# Patient Record
Sex: Female | Born: 1950 | Race: White | Hispanic: No | Smoking: Never smoker
Health system: Southern US, Community
[De-identification: ages and names within clinical notes are randomized; demographics above are authoritative.]

## PROBLEM LIST (undated history)

## (undated) DIAGNOSIS — E079 Disorder of thyroid, unspecified: Secondary | ICD-10-CM

## (undated) DIAGNOSIS — D219 Benign neoplasm of connective and other soft tissue, unspecified: Secondary | ICD-10-CM

## (undated) DIAGNOSIS — E785 Hyperlipidemia, unspecified: Secondary | ICD-10-CM

## (undated) DIAGNOSIS — C801 Malignant (primary) neoplasm, unspecified: Secondary | ICD-10-CM

## (undated) DIAGNOSIS — G43909 Migraine, unspecified, not intractable, without status migrainosus: Secondary | ICD-10-CM

## (undated) DIAGNOSIS — B009 Herpesviral infection, unspecified: Secondary | ICD-10-CM

## (undated) HISTORY — PX: CATARACT EXTRACTION: SUR2

## (undated) HISTORY — DX: Hyperlipidemia, unspecified: E78.5

## (undated) HISTORY — PX: UMBILICAL HERNIA REPAIR: SHX196

## (undated) HISTORY — DX: Disorder of thyroid, unspecified: E07.9

## (undated) HISTORY — PX: TUBAL LIGATION: SHX77

## (undated) HISTORY — DX: Malignant (primary) neoplasm, unspecified: C80.1

## (undated) HISTORY — DX: Benign neoplasm of connective and other soft tissue, unspecified: D21.9

## (undated) HISTORY — DX: Herpesviral infection, unspecified: B00.9

## (undated) HISTORY — DX: Migraine, unspecified, not intractable, without status migrainosus: G43.909

## (undated) HISTORY — PX: COLONOSCOPY: SHX174

---

## 1998-09-08 ENCOUNTER — Other Ambulatory Visit: Admission: RE | Admit: 1998-09-08 | Discharge: 1998-09-08 | Payer: Self-pay | Admitting: *Deleted

## 2000-04-09 ENCOUNTER — Other Ambulatory Visit: Admission: RE | Admit: 2000-04-09 | Discharge: 2000-04-09 | Payer: Self-pay | Admitting: *Deleted

## 2003-01-14 ENCOUNTER — Other Ambulatory Visit: Admission: RE | Admit: 2003-01-14 | Discharge: 2003-01-14 | Payer: Self-pay | Admitting: Obstetrics and Gynecology

## 2004-04-14 ENCOUNTER — Ambulatory Visit: Payer: Self-pay | Admitting: Psychology

## 2004-04-25 ENCOUNTER — Ambulatory Visit: Payer: Self-pay | Admitting: Psychology

## 2004-05-16 ENCOUNTER — Ambulatory Visit: Payer: Self-pay | Admitting: Psychology

## 2004-05-29 ENCOUNTER — Ambulatory Visit: Payer: Self-pay | Admitting: Psychology

## 2004-06-27 ENCOUNTER — Ambulatory Visit: Payer: Self-pay | Admitting: Psychology

## 2004-07-20 ENCOUNTER — Ambulatory Visit: Payer: Self-pay | Admitting: Psychology

## 2004-08-03 ENCOUNTER — Ambulatory Visit: Payer: Self-pay | Admitting: Psychology

## 2004-09-14 ENCOUNTER — Other Ambulatory Visit: Admission: RE | Admit: 2004-09-14 | Discharge: 2004-09-14 | Payer: Self-pay | Admitting: *Deleted

## 2004-09-14 ENCOUNTER — Ambulatory Visit: Payer: Self-pay | Admitting: Psychology

## 2004-09-29 ENCOUNTER — Ambulatory Visit: Payer: Self-pay | Admitting: Psychology

## 2004-10-27 ENCOUNTER — Ambulatory Visit: Payer: Self-pay | Admitting: Psychology

## 2004-11-24 ENCOUNTER — Ambulatory Visit: Payer: Self-pay | Admitting: Psychology

## 2004-12-04 ENCOUNTER — Ambulatory Visit: Payer: Self-pay | Admitting: Psychology

## 2005-01-05 ENCOUNTER — Ambulatory Visit: Payer: Self-pay | Admitting: Psychology

## 2005-01-11 ENCOUNTER — Ambulatory Visit: Payer: Self-pay | Admitting: Psychology

## 2005-02-06 ENCOUNTER — Ambulatory Visit: Payer: Self-pay | Admitting: Psychology

## 2005-03-21 ENCOUNTER — Ambulatory Visit: Payer: Self-pay | Admitting: Psychology

## 2005-05-25 ENCOUNTER — Ambulatory Visit: Payer: Self-pay | Admitting: Psychology

## 2005-06-18 ENCOUNTER — Ambulatory Visit: Payer: Self-pay | Admitting: Psychology

## 2005-07-02 ENCOUNTER — Ambulatory Visit: Payer: Self-pay | Admitting: Psychology

## 2005-07-16 ENCOUNTER — Ambulatory Visit: Payer: Self-pay | Admitting: Psychology

## 2005-07-30 ENCOUNTER — Ambulatory Visit: Payer: Self-pay | Admitting: Psychology

## 2005-08-13 ENCOUNTER — Ambulatory Visit: Payer: Self-pay | Admitting: Psychology

## 2005-09-24 ENCOUNTER — Ambulatory Visit: Payer: Self-pay | Admitting: Psychology

## 2005-10-08 ENCOUNTER — Ambulatory Visit: Payer: Self-pay | Admitting: Psychology

## 2005-10-22 ENCOUNTER — Ambulatory Visit: Payer: Self-pay | Admitting: Psychology

## 2005-11-16 ENCOUNTER — Ambulatory Visit: Payer: Self-pay | Admitting: Psychology

## 2005-11-19 ENCOUNTER — Ambulatory Visit: Payer: Self-pay | Admitting: Psychology

## 2005-11-23 ENCOUNTER — Ambulatory Visit: Payer: Self-pay | Admitting: Psychology

## 2005-12-03 ENCOUNTER — Ambulatory Visit: Payer: Self-pay | Admitting: Psychology

## 2005-12-17 ENCOUNTER — Ambulatory Visit: Payer: Self-pay | Admitting: Psychology

## 2005-12-19 ENCOUNTER — Ambulatory Visit: Payer: Self-pay | Admitting: Psychology

## 2005-12-24 ENCOUNTER — Ambulatory Visit: Payer: Self-pay | Admitting: Psychology

## 2006-01-03 ENCOUNTER — Ambulatory Visit: Payer: Self-pay | Admitting: Psychology

## 2006-01-14 ENCOUNTER — Ambulatory Visit: Payer: Self-pay | Admitting: Psychology

## 2006-01-28 ENCOUNTER — Ambulatory Visit: Payer: Self-pay | Admitting: Psychology

## 2006-02-25 ENCOUNTER — Ambulatory Visit: Payer: Self-pay | Admitting: Psychology

## 2006-03-25 ENCOUNTER — Ambulatory Visit: Payer: Self-pay | Admitting: Psychology

## 2006-04-08 ENCOUNTER — Ambulatory Visit: Payer: Self-pay | Admitting: Psychology

## 2006-04-26 ENCOUNTER — Ambulatory Visit: Payer: Self-pay | Admitting: Internal Medicine

## 2006-04-30 ENCOUNTER — Other Ambulatory Visit: Admission: RE | Admit: 2006-04-30 | Discharge: 2006-04-30 | Payer: Self-pay | Admitting: Obstetrics & Gynecology

## 2006-05-03 ENCOUNTER — Ambulatory Visit: Payer: Self-pay | Admitting: Psychology

## 2006-05-15 ENCOUNTER — Ambulatory Visit: Payer: Self-pay | Admitting: Psychology

## 2006-05-17 ENCOUNTER — Ambulatory Visit: Payer: Self-pay | Admitting: Internal Medicine

## 2006-06-03 ENCOUNTER — Ambulatory Visit: Payer: Self-pay | Admitting: Psychology

## 2006-06-17 ENCOUNTER — Ambulatory Visit: Payer: Self-pay | Admitting: Psychology

## 2006-07-03 ENCOUNTER — Ambulatory Visit: Payer: Self-pay | Admitting: Psychology

## 2006-07-16 ENCOUNTER — Ambulatory Visit: Payer: Self-pay | Admitting: Psychology

## 2006-07-26 ENCOUNTER — Ambulatory Visit: Payer: Self-pay | Admitting: Psychology

## 2006-07-30 ENCOUNTER — Ambulatory Visit: Payer: Self-pay | Admitting: Psychology

## 2006-08-13 ENCOUNTER — Ambulatory Visit: Payer: Self-pay | Admitting: Psychology

## 2006-09-13 ENCOUNTER — Ambulatory Visit: Payer: Self-pay | Admitting: Psychology

## 2006-09-24 ENCOUNTER — Ambulatory Visit: Payer: Self-pay | Admitting: Psychology

## 2006-10-08 ENCOUNTER — Ambulatory Visit: Payer: Self-pay | Admitting: Psychology

## 2006-10-22 ENCOUNTER — Ambulatory Visit: Payer: Self-pay | Admitting: Psychology

## 2006-11-11 ENCOUNTER — Ambulatory Visit: Payer: Self-pay | Admitting: Psychology

## 2006-12-06 ENCOUNTER — Ambulatory Visit: Payer: Self-pay | Admitting: Psychology

## 2006-12-23 ENCOUNTER — Ambulatory Visit: Payer: Self-pay | Admitting: Psychology

## 2007-01-14 ENCOUNTER — Ambulatory Visit: Payer: Self-pay | Admitting: Psychology

## 2007-01-28 ENCOUNTER — Ambulatory Visit: Payer: Self-pay | Admitting: Psychology

## 2007-02-18 ENCOUNTER — Ambulatory Visit: Payer: Self-pay | Admitting: Psychology

## 2007-03-05 ENCOUNTER — Ambulatory Visit: Payer: Self-pay | Admitting: Psychology

## 2007-03-24 ENCOUNTER — Ambulatory Visit: Payer: Self-pay | Admitting: Psychology

## 2007-04-08 ENCOUNTER — Ambulatory Visit: Payer: Self-pay | Admitting: Psychology

## 2007-05-02 ENCOUNTER — Ambulatory Visit: Payer: Self-pay | Admitting: Psychology

## 2007-05-20 ENCOUNTER — Ambulatory Visit: Payer: Self-pay | Admitting: Psychology

## 2007-06-03 ENCOUNTER — Ambulatory Visit: Payer: Self-pay | Admitting: Psychology

## 2007-06-17 ENCOUNTER — Other Ambulatory Visit: Admission: RE | Admit: 2007-06-17 | Discharge: 2007-06-17 | Payer: Self-pay | Admitting: Obstetrics and Gynecology

## 2007-06-19 ENCOUNTER — Ambulatory Visit: Payer: Self-pay | Admitting: Psychology

## 2007-07-29 ENCOUNTER — Ambulatory Visit: Payer: Self-pay | Admitting: Psychology

## 2007-09-25 ENCOUNTER — Ambulatory Visit: Payer: Self-pay | Admitting: Psychology

## 2007-11-10 ENCOUNTER — Ambulatory Visit: Payer: Self-pay | Admitting: Psychology

## 2008-01-06 ENCOUNTER — Ambulatory Visit: Payer: Self-pay | Admitting: Psychology

## 2008-02-17 ENCOUNTER — Ambulatory Visit: Payer: Self-pay | Admitting: Psychology

## 2008-04-06 ENCOUNTER — Ambulatory Visit: Payer: Self-pay | Admitting: Psychology

## 2008-05-25 ENCOUNTER — Ambulatory Visit: Payer: Self-pay | Admitting: Psychology

## 2008-06-24 ENCOUNTER — Other Ambulatory Visit: Admission: RE | Admit: 2008-06-24 | Discharge: 2008-06-24 | Payer: Self-pay | Admitting: Obstetrics and Gynecology

## 2008-11-29 ENCOUNTER — Ambulatory Visit (HOSPITAL_COMMUNITY): Admission: RE | Admit: 2008-11-29 | Discharge: 2008-11-29 | Payer: Self-pay | Admitting: Internal Medicine

## 2008-11-29 ENCOUNTER — Encounter: Payer: Self-pay | Admitting: Pulmonary Disease

## 2008-12-16 ENCOUNTER — Ambulatory Visit: Payer: Self-pay | Admitting: Pulmonary Disease

## 2008-12-16 DIAGNOSIS — E785 Hyperlipidemia, unspecified: Secondary | ICD-10-CM | POA: Insufficient documentation

## 2008-12-16 DIAGNOSIS — R05 Cough: Secondary | ICD-10-CM

## 2009-08-04 ENCOUNTER — Encounter: Admission: RE | Admit: 2009-08-04 | Discharge: 2009-08-04 | Payer: Self-pay | Admitting: Internal Medicine

## 2009-12-19 ENCOUNTER — Ambulatory Visit: Payer: Self-pay | Admitting: Psychology

## 2009-12-26 ENCOUNTER — Ambulatory Visit: Payer: Self-pay | Admitting: Psychology

## 2010-01-09 ENCOUNTER — Ambulatory Visit: Payer: Self-pay | Admitting: Psychology

## 2010-01-20 ENCOUNTER — Ambulatory Visit: Payer: Self-pay | Admitting: Psychology

## 2010-02-02 ENCOUNTER — Ambulatory Visit: Payer: Self-pay | Admitting: Psychology

## 2010-02-06 ENCOUNTER — Ambulatory Visit: Payer: Self-pay | Admitting: Psychology

## 2010-02-20 ENCOUNTER — Ambulatory Visit: Payer: Self-pay | Admitting: Psychology

## 2010-03-15 ENCOUNTER — Ambulatory Visit: Payer: Self-pay | Admitting: Psychology

## 2010-07-07 ENCOUNTER — Other Ambulatory Visit: Payer: Self-pay | Admitting: Certified Nurse Midwife

## 2010-07-07 DIAGNOSIS — E041 Nontoxic single thyroid nodule: Secondary | ICD-10-CM

## 2010-07-13 ENCOUNTER — Ambulatory Visit
Admission: RE | Admit: 2010-07-13 | Discharge: 2010-07-13 | Disposition: A | Discharge status: Home / Self Care | Payer: BC Managed Care – PPO | Source: Ambulatory Visit | Attending: Certified Nurse Midwife | Admitting: Certified Nurse Midwife

## 2010-07-13 DIAGNOSIS — E041 Nontoxic single thyroid nodule: Secondary | ICD-10-CM

## 2010-07-27 ENCOUNTER — Other Ambulatory Visit: Payer: Self-pay | Admitting: Internal Medicine

## 2010-07-27 DIAGNOSIS — R0989 Other specified symptoms and signs involving the circulatory and respiratory systems: Secondary | ICD-10-CM

## 2010-08-01 ENCOUNTER — Ambulatory Visit
Admission: RE | Admit: 2010-08-01 | Discharge: 2010-08-01 | Disposition: A | Discharge status: Home / Self Care | Payer: BC Managed Care – PPO | Source: Ambulatory Visit | Attending: Internal Medicine | Admitting: Internal Medicine

## 2010-08-01 DIAGNOSIS — R0989 Other specified symptoms and signs involving the circulatory and respiratory systems: Secondary | ICD-10-CM

## 2010-09-10 HISTORY — PX: OTHER SURGICAL HISTORY: SHX169

## 2010-09-11 ENCOUNTER — Other Ambulatory Visit: Payer: Self-pay | Admitting: Internal Medicine

## 2010-09-11 DIAGNOSIS — E042 Nontoxic multinodular goiter: Secondary | ICD-10-CM

## 2010-09-18 LAB — BASIC METABOLIC PANEL
BUN: 7 mg/dL (ref 6–23)
CO2: 28 mEq/L (ref 19–32)
Chloride: 106 mEq/L (ref 96–112)
Creatinine, Ser: 0.62 mg/dL (ref 0.4–1.2)
GFR calc Af Amer: >60 mL/min (ref >60)
Glucose, Bld: 98 mg/dL (ref 70–99)
Potassium: 3.8 mEq/L (ref 3.5–5.1)
Sodium: 142 mEq/L (ref 135–145)

## 2010-10-04 ENCOUNTER — Ambulatory Visit
Admission: RE | Admit: 2010-10-04 | Discharge: 2010-10-04 | Disposition: A | Discharge status: Home / Self Care | Payer: BC Managed Care – PPO | Source: Ambulatory Visit | Attending: Internal Medicine | Admitting: Internal Medicine

## 2010-10-04 ENCOUNTER — Other Ambulatory Visit (HOSPITAL_COMMUNITY)
Admission: RE | Admit: 2010-10-04 | Discharge: 2010-10-04 | Disposition: A | Discharge status: Home / Self Care | Payer: BC Managed Care – PPO | Source: Ambulatory Visit | Attending: Interventional Radiology | Admitting: Interventional Radiology

## 2010-10-04 ENCOUNTER — Other Ambulatory Visit: Payer: Self-pay | Admitting: Interventional Radiology

## 2010-10-04 DIAGNOSIS — E049 Nontoxic goiter, unspecified: Secondary | ICD-10-CM | POA: Insufficient documentation

## 2010-10-04 DIAGNOSIS — E042 Nontoxic multinodular goiter: Secondary | ICD-10-CM

## 2011-06-21 ENCOUNTER — Other Ambulatory Visit: Payer: Self-pay | Admitting: Dermatology

## 2012-05-19 IMAGING — US US SOFT TISSUE HEAD/NECK
1 series · 14 of 25 positions shown · non-contrast
Comparison: Ultrasound of the thyroid of 08/04/2009

CLINICAL DATA: Follow up thyroid nodule

THYROID ULTRASOUND
TECHNIQUE: Ultrasound examination of the thyroid gland and adjacent
soft tissues was performed.

[Series 1: us soft tissue head/neck · 0.07mm/px · 14 of 52 slices shown]
[im 1/52]
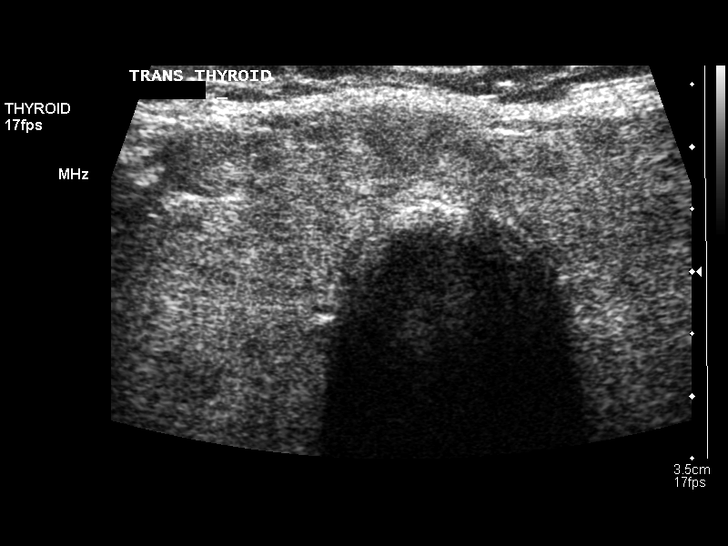
[im 5/52]
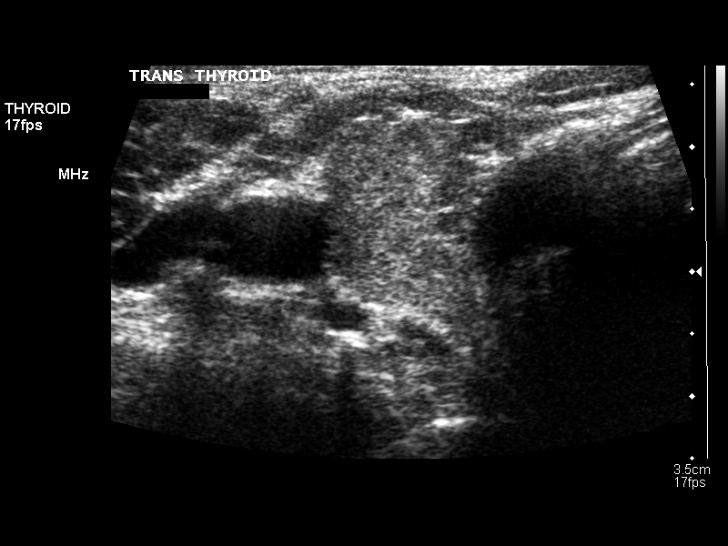
[im 9/52]
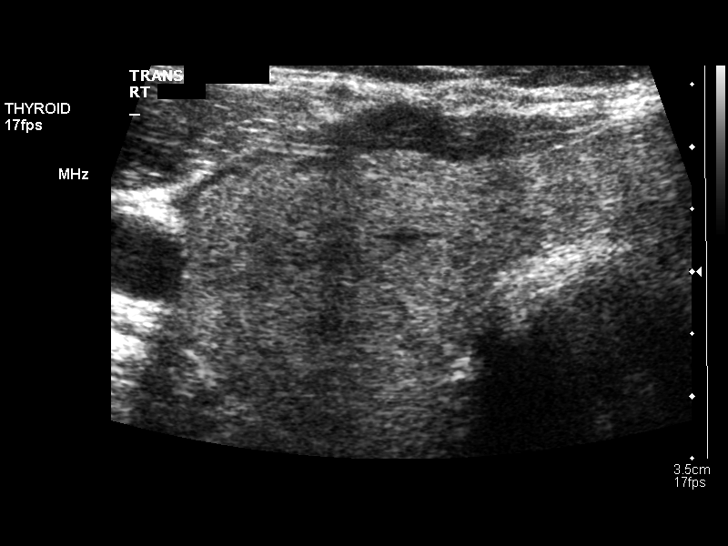
[im 13/52]
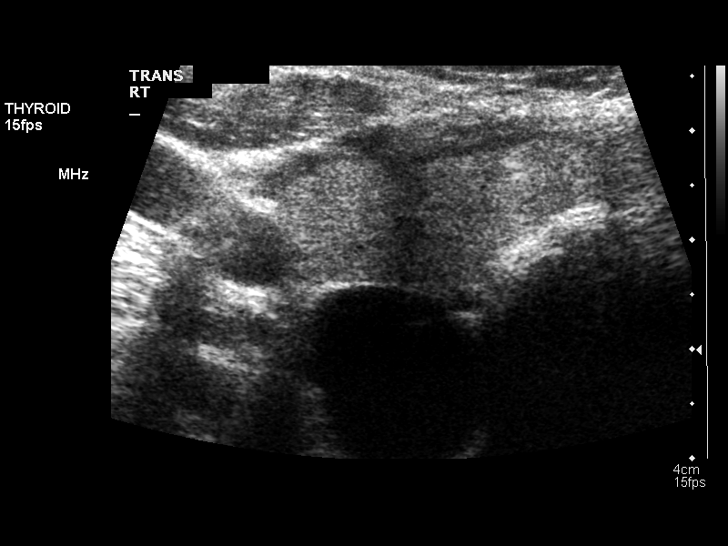
[im 18/52]
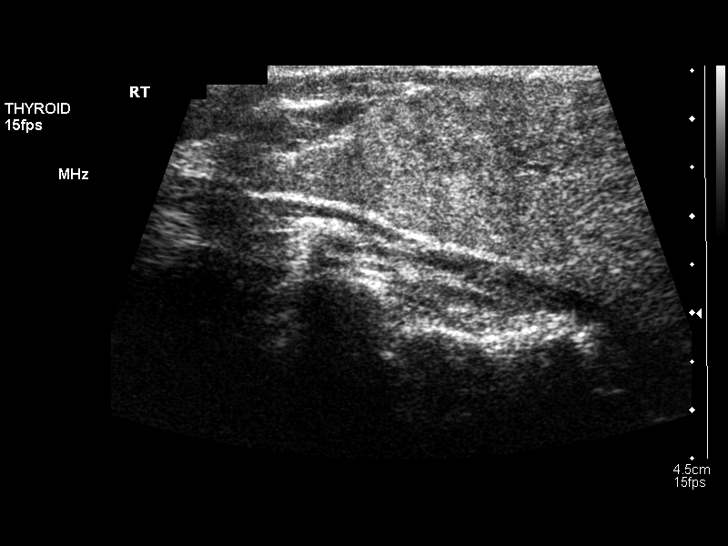
[im 20/52]
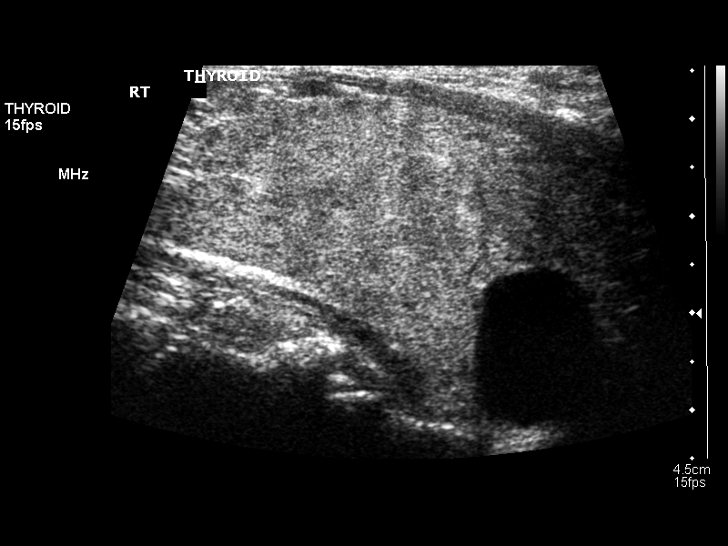
[im 24/52]
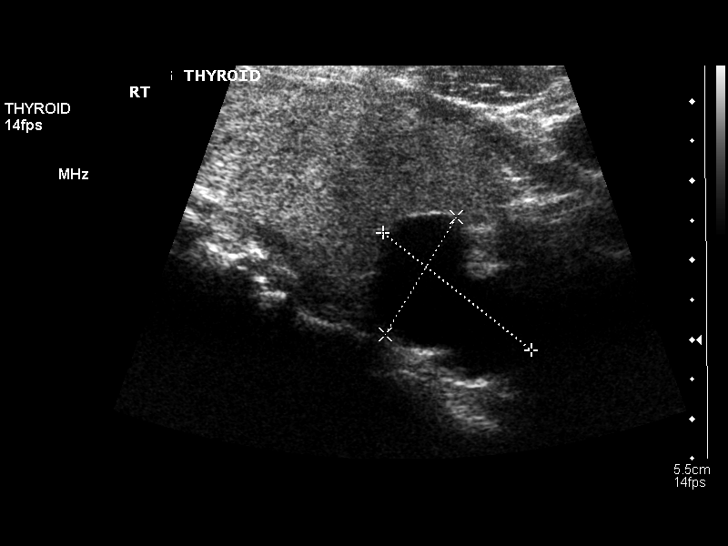
[im 28/52]
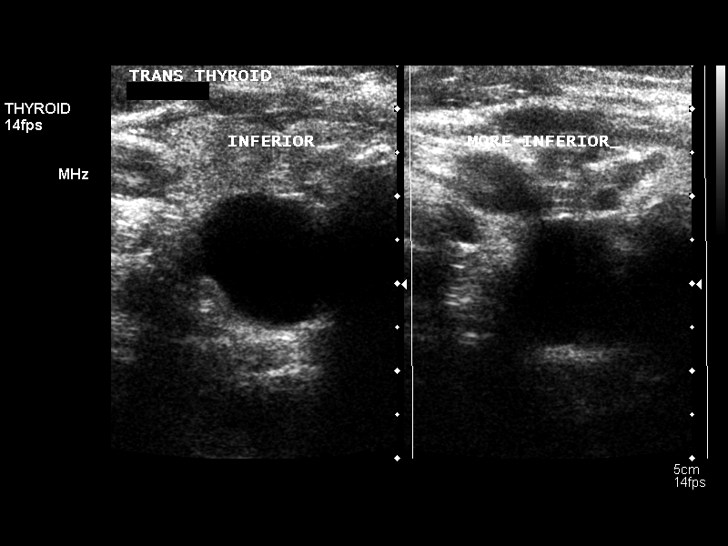
[im 32/52]
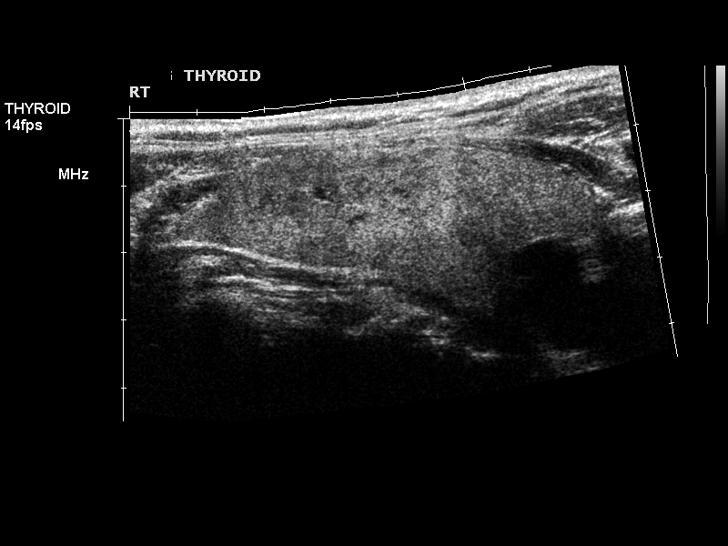
[im 35/52]
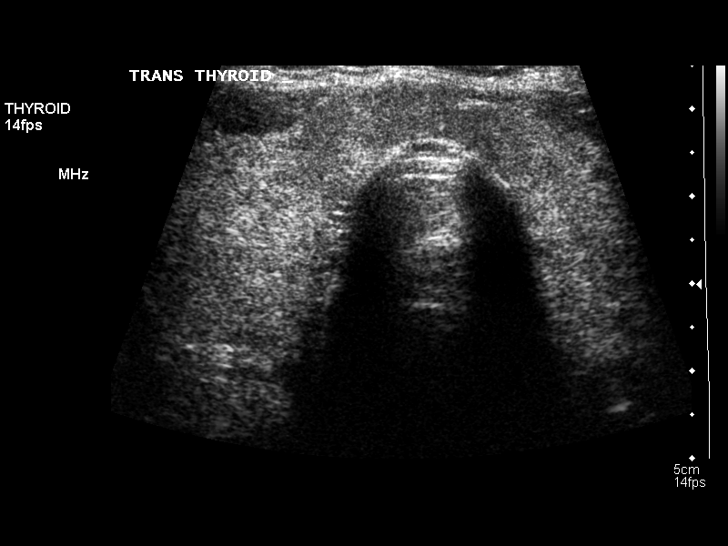
[im 39/52]
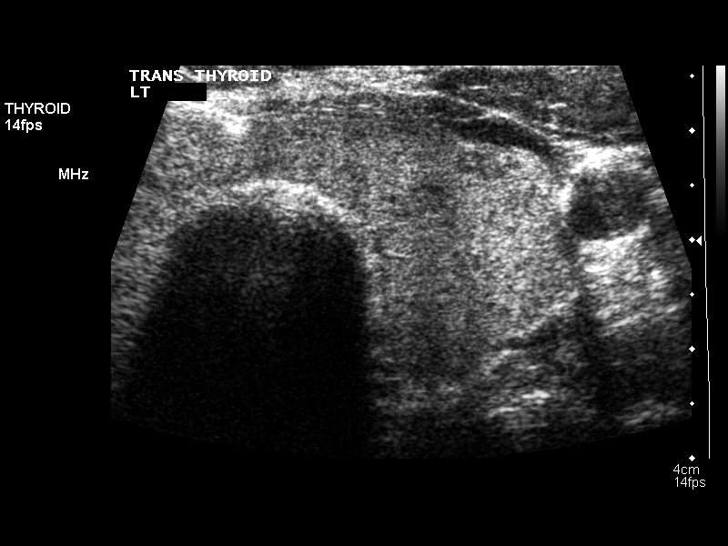
[im 43/52]
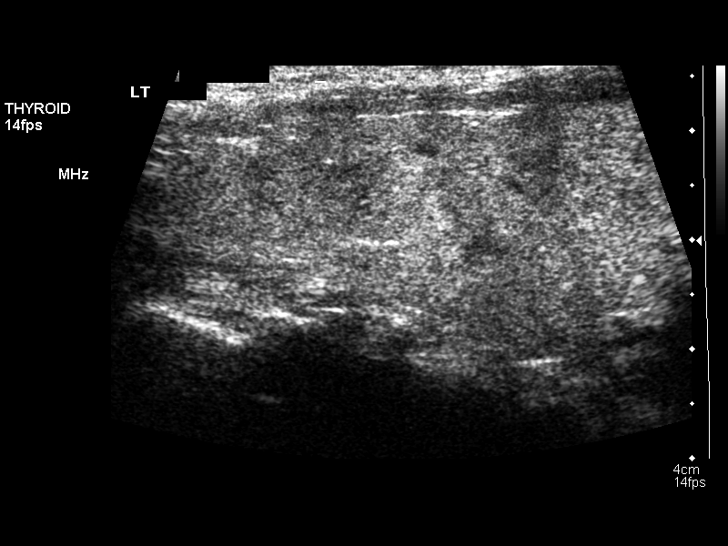
[im 47/52]
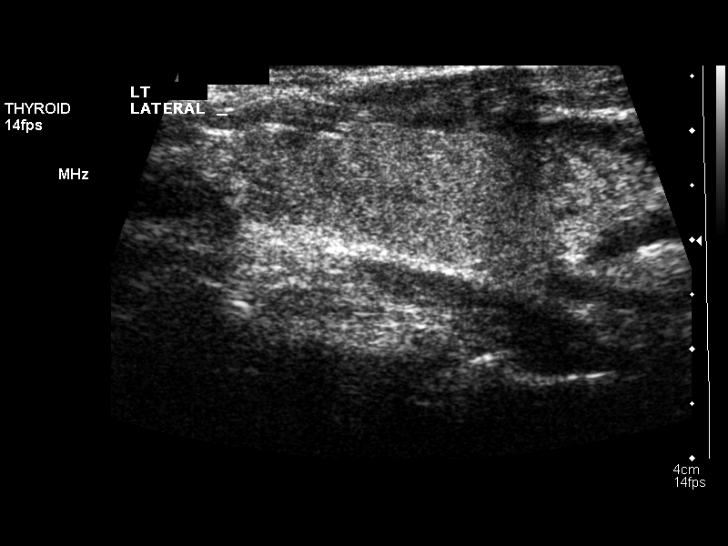
[im 52/52]
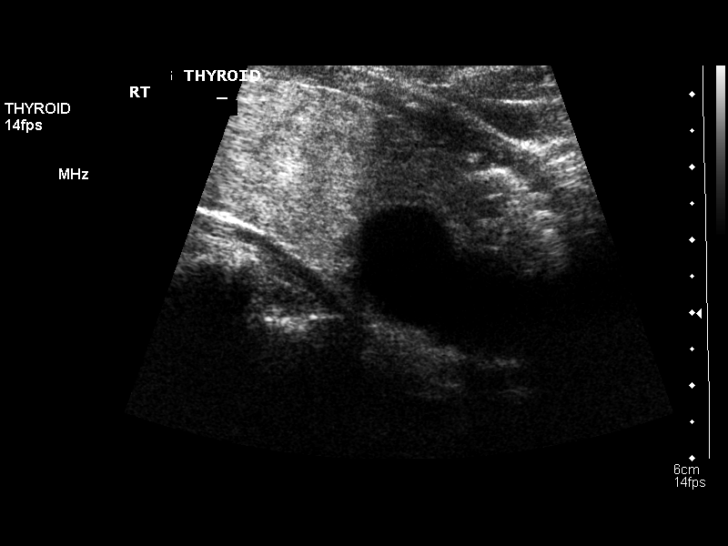

[14 of 25 positions shown; findings below may reference images not displayed]

FINDINGS: Right thyroid lobe:  6.4 x 2.4 x 2.7 cm.  (Previously 6.4 x 2.6 x
2.8 cm).
Left thyroid lobe:  5.8 x 2.1 x 2.0 cm.  (Previously 5.6 x 1.9 x
2.2 cm).
Isthmus:  7 mm, which is stable.

Focal nodules:  The gland is diffusely inhomogeneous.  No definite
solid nodule is noted. A bilobular cyst emanates from the lower
pole of the right lobe measuring 3.0 x 1.7 x 1.6 cm, compared to
prior measurements of 1.6 x 1.1 x 1.3 cm.

Lymphadenopathy:  None visualized.
IMPRESSION: The thyroid gland remains inhomogeneous and diffusely enlarged.  A
bilobular cyst in the lower pole of the right lobe has enlarged in
the interval.

## 2012-06-20 ENCOUNTER — Other Ambulatory Visit: Payer: Self-pay

## 2012-08-10 IMAGING — US US THYROID BIOPSY
1 series · 5 of 5 positions shown · non-contrast
Comparison: none

Clinical Data/Indication: Enlarging right thyroid cyst

ULTRASOUND-GUIDED BIOPSY .  FINE NEEDLE ASPIRATION OF A ENLARGING
RIGHT THYROID CYST.
Procedure: The procedure, risks, benefits, and alternatives were
explained to the patient. Questions regarding the procedure were
encouraged and answered. The patient understands and consents to
the procedure.
The neck was prepped with betadine in a sterile fashion, and a
sterile drape was applied covering the operative field.
Under sonographic guidance, a 22 gauge needle was inserted into the
right thyroid cyst.  3 ml slightly yellowish clear fluid was
obtained. Final imaging was performed.

[Series 1: us thyroid biopsy · 0.09mm/px · 5 acquisitions, 5 frames shown]
[im 1/5]
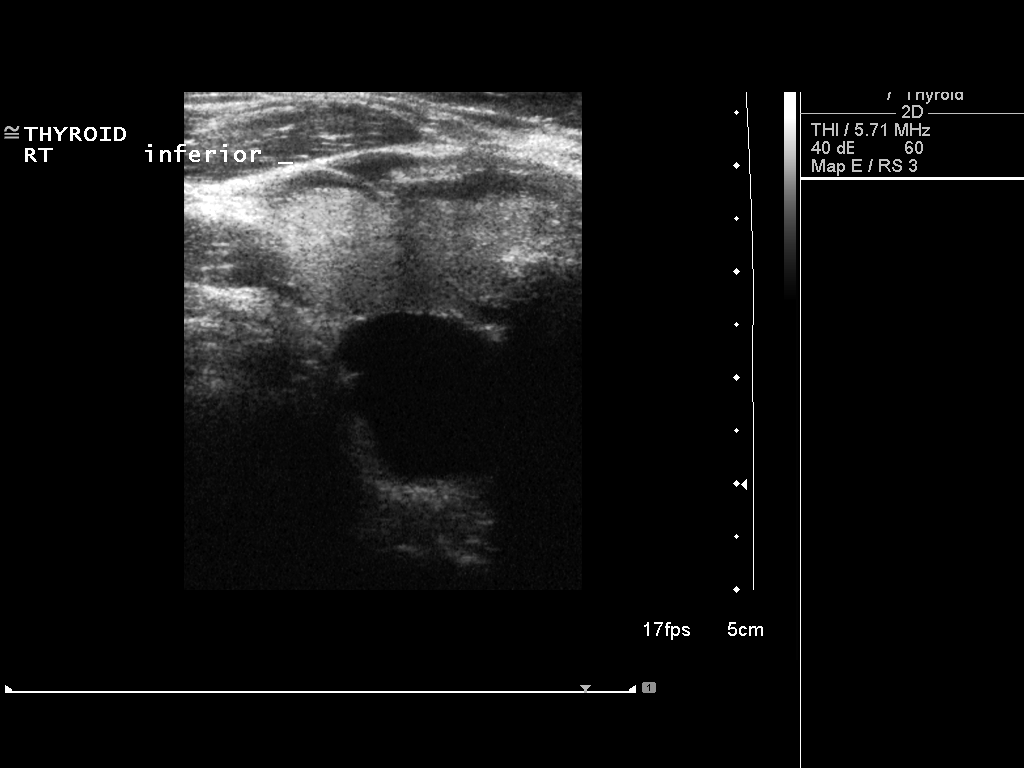
[im 2/5]
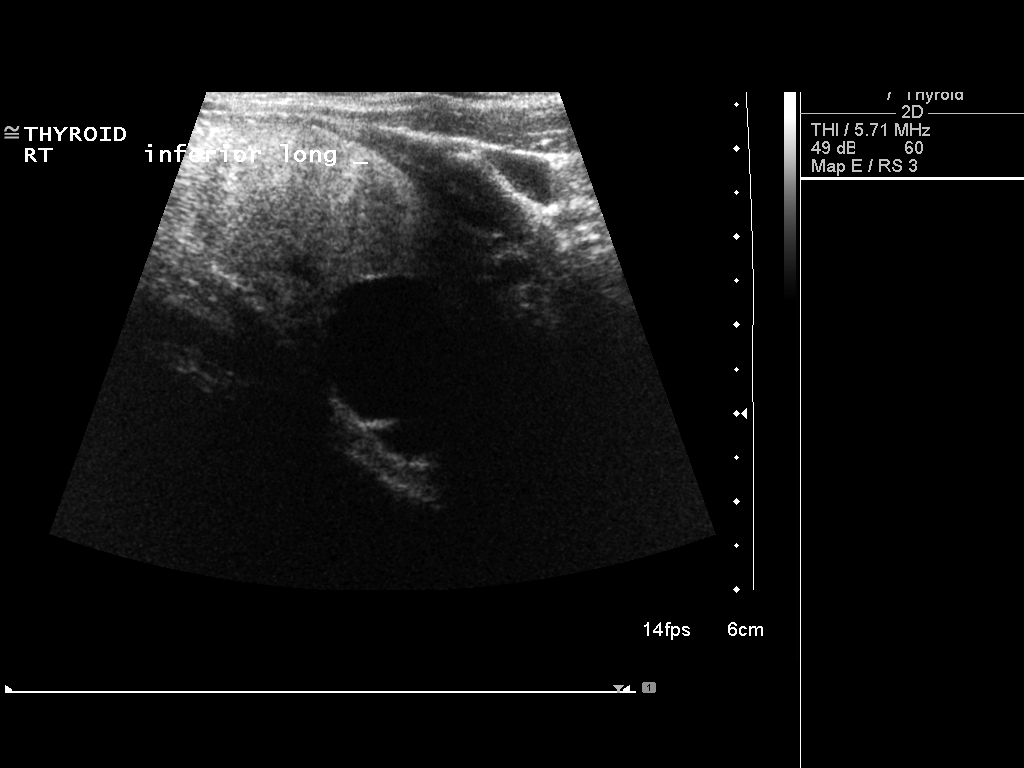
[im 3/5]
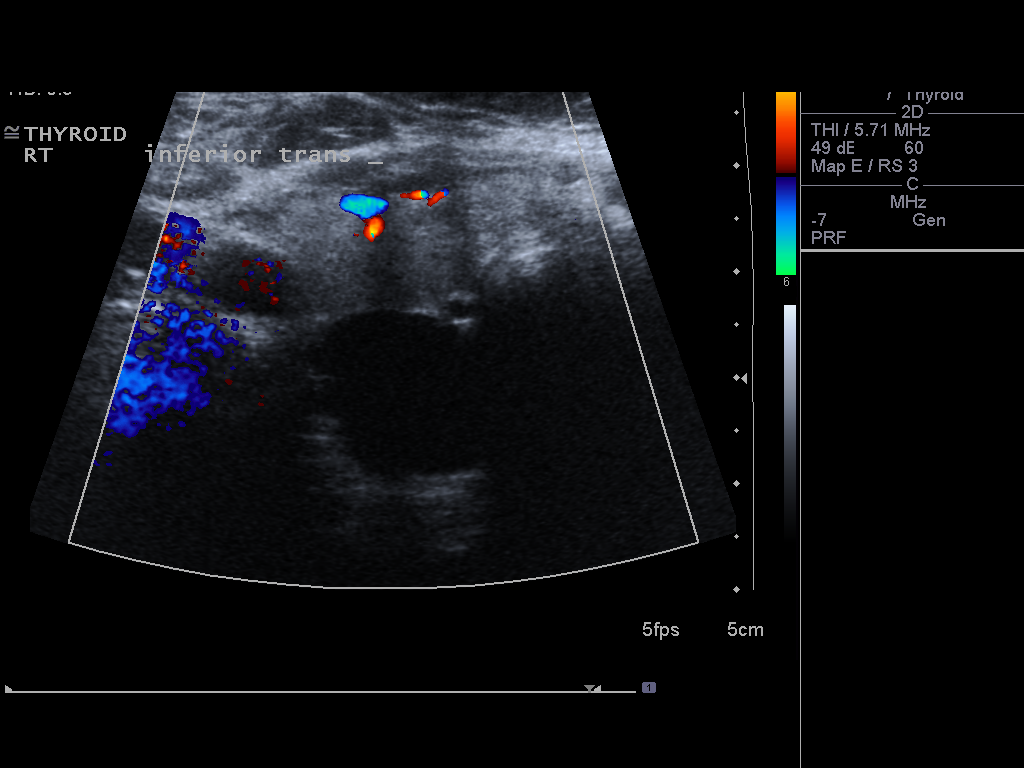
[im 4/5]
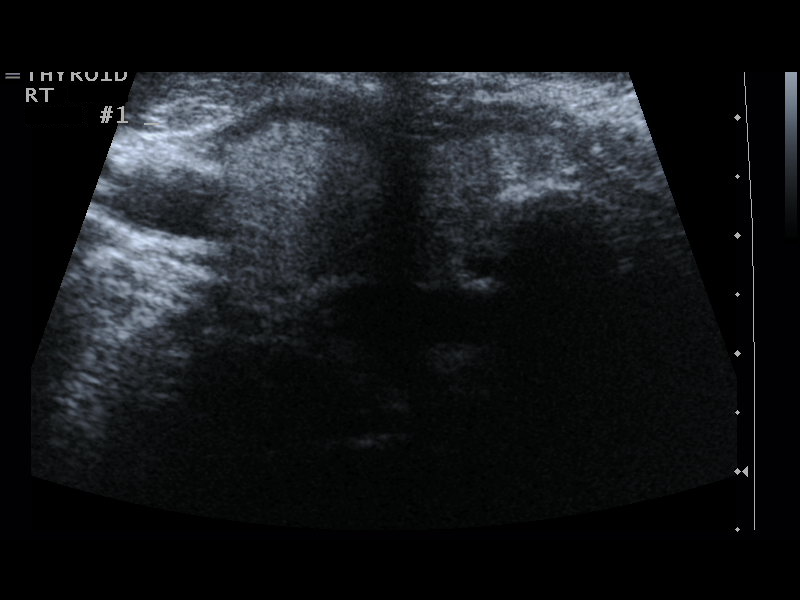
[im 5/5]
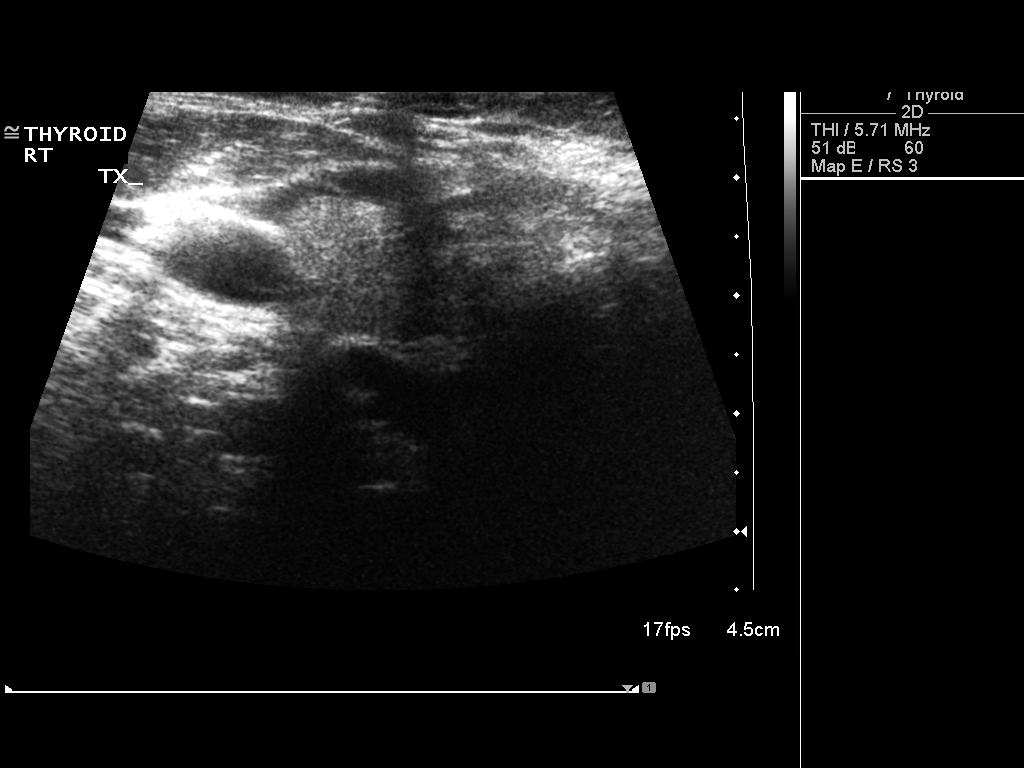

[5 of 5 positions shown; findings below may reference images not displayed]

FINDINGS: The images document guide needle placement within the
right thyroid cyst. Post biopsy images demonstrate decompression of
the cyst.
IMPRESSION: Successful ultrasound-guided aspiration right thyroid cyst.
Contents were sent for pathological analysis.

## 2013-05-04 ENCOUNTER — Other Ambulatory Visit: Payer: Self-pay | Admitting: Dermatology

## 2013-06-23 ENCOUNTER — Other Ambulatory Visit: Payer: Self-pay

## 2013-06-23 DIAGNOSIS — Z85828 Personal history of other malignant neoplasm of skin: Secondary | ICD-10-CM | POA: Diagnosis not present

## 2013-06-23 DIAGNOSIS — C44611 Basal cell carcinoma of skin of unspecified upper limb, including shoulder: Secondary | ICD-10-CM | POA: Diagnosis not present

## 2013-06-23 DIAGNOSIS — L570 Actinic keratosis: Secondary | ICD-10-CM | POA: Diagnosis not present

## 2013-06-23 DIAGNOSIS — L905 Scar conditions and fibrosis of skin: Secondary | ICD-10-CM | POA: Diagnosis not present

## 2013-06-23 DIAGNOSIS — L259 Unspecified contact dermatitis, unspecified cause: Secondary | ICD-10-CM | POA: Diagnosis not present

## 2013-06-23 DIAGNOSIS — D2360 Other benign neoplasm of skin of unspecified upper limb, including shoulder: Secondary | ICD-10-CM | POA: Diagnosis not present

## 2013-06-23 DIAGNOSIS — L821 Other seborrheic keratosis: Secondary | ICD-10-CM | POA: Diagnosis not present

## 2013-06-23 DIAGNOSIS — L723 Sebaceous cyst: Secondary | ICD-10-CM | POA: Diagnosis not present

## 2013-07-02 DIAGNOSIS — Z85828 Personal history of other malignant neoplasm of skin: Secondary | ICD-10-CM | POA: Diagnosis not present

## 2013-07-02 DIAGNOSIS — C44519 Basal cell carcinoma of skin of other part of trunk: Secondary | ICD-10-CM | POA: Diagnosis not present

## 2013-07-30 DIAGNOSIS — E041 Nontoxic single thyroid nodule: Secondary | ICD-10-CM | POA: Diagnosis not present

## 2013-07-30 DIAGNOSIS — R809 Proteinuria, unspecified: Secondary | ICD-10-CM | POA: Diagnosis not present

## 2013-07-30 DIAGNOSIS — E785 Hyperlipidemia, unspecified: Secondary | ICD-10-CM | POA: Diagnosis not present

## 2013-07-30 DIAGNOSIS — R82998 Other abnormal findings in urine: Secondary | ICD-10-CM | POA: Diagnosis not present

## 2013-08-05 ENCOUNTER — Encounter: Payer: Self-pay | Admitting: Certified Nurse Midwife

## 2013-08-11 ENCOUNTER — Encounter: Payer: Self-pay | Admitting: Certified Nurse Midwife

## 2013-08-11 ENCOUNTER — Ambulatory Visit (INDEPENDENT_AMBULATORY_CARE_PROVIDER_SITE_OTHER): Payer: Medicare Other | Admitting: Certified Nurse Midwife

## 2013-08-11 VITALS — BP 124/76 | HR 78 | Resp 16 | Ht 65.5 in | Wt 183.0 lb

## 2013-08-11 DIAGNOSIS — Z01419 Encounter for gynecological examination (general) (routine) without abnormal findings: Secondary | ICD-10-CM | POA: Diagnosis not present

## 2013-08-11 DIAGNOSIS — Z0000 Encounter for general adult medical examination without abnormal findings: Secondary | ICD-10-CM

## 2013-08-11 DIAGNOSIS — Z124 Encounter for screening for malignant neoplasm of cervix: Secondary | ICD-10-CM | POA: Diagnosis not present

## 2013-08-11 NOTE — Progress Notes (Signed)
Reviewed personally.  M. Suzanne Guyla Bless, MD.  

## 2013-08-11 NOTE — Patient Instructions (Signed)

## 2013-08-11 NOTE — Progress Notes (Signed)
63 y.o. G8P2002 Divorced Caucasian Fe here for annual exam.  Menopausal no vaginal bleeding. Patient using Replens for vaginal dryness working well. PCP does aex and medication management for Thyroid and cholesterol. Sexually active, no STD concerns or testing needed. No health issues today.  Patient's last menstrual period was 06/12/1999.          Sexually active: yes  The current method of family planning is tubal ligation.    Exercising: no  exercise Smoker:  no  Health Maintenance: Pap: 07-11-11 neg HPV HR neg MMG:  2014 has scheduled Colonoscopy:  2007 due 2017, PCP does IFOB BMD:   2011 due this year TDaP:  2007 Labs: none Self breast exam: not done   reports that she has never smoked. She does not have any smokeless tobacco history on file. She reports that she does not drink alcohol or use illicit drugs.  Past Medical History  Diagnosis Date  . Fibroid     small  . Migraines     with aura  . Thyroid disease     hypothyroid  . Cancer     skin    Past Surgical History  Procedure Laterality Date  . Umbilical hernia repair      with BTL  . Tubal ligation    . Thyroid  nodule aspirated  4/12    cyst benign    Current Outpatient Prescriptions  Medication Sig Dispense Refill  . aspirin 81 MG tablet Take 81 mg by mouth daily.      Marland Kitchen atorvastatin (LIPITOR) 10 MG tablet Take 10 mg by mouth daily.      Marland Kitchen BIOTIN PO Take by mouth daily.      . clobetasol cream (TEMOVATE) 0.05 % as needed.      Marland Kitchen levothyroxine (SYNTHROID, LEVOTHROID) 88 MCG tablet Take 88 mcg by mouth daily before breakfast.      . Multiple Vitamins-Minerals (MULTIVITAMIN PO) Take by mouth daily.      . Omeprazole (PRILOSEC PO) Take by mouth daily.       No current facility-administered medications for this visit.    Family History  Problem Relation Age of Onset  . Other Mother     brain tumor    ROS:  Pertinent items are noted in HPI.  Otherwise, a comprehensive ROS was negative.  Exam:   BP  124/76  Pulse 78  Resp 16  Ht 5' 5.5" (1.664 m)  Wt 183 lb (83.008 kg)  BMI 29.98 kg/m2  LMP 06/12/1999 Height: 5' 5.5" (166.4 cm)  Ht Readings from Last 3 Encounters:  08/11/13 5' 5.5" (1.664 m)  12/16/08 5\' 7"  (1.702 m)    General appearance: alert, cooperative and appears stated age Head: Normocephalic, without obvious abnormality, atraumatic Neck: no adenopathy, supple, symmetrical, trachea midline and thyroid normal to inspection and palpation Lungs: clear to auscultation bilaterally Breasts: normal appearance, no masses or tenderness, No nipple retraction or dimpling, No nipple discharge or bleeding, No axillary or supraclavicular adenopathy Heart: regular rate and rhythm Abdomen: soft, non-tender; no masses,  no organomegaly Extremities: extremities normal, atraumatic, no cyanosis or edema Skin: Skin color, texture, turgor normal. No rashes or lesions Lymph nodes: Cervical, supraclavicular, and axillary nodes normal. No abnormal inguinal nodes palpated Neurologic: Grossly normal   Pelvic: External genitalia:  no lesions              Urethra:  normal appearing urethra with no masses, tenderness or lesions  Bartholin's and Skene's: normal                 Vagina: normal appearing vagina with normal color and discharge, no lesions, no atrophy noted              Cervix: normal appearance, non tender              Pap taken: yes Bimanual Exam:  Uterus:  normal size, contour, position, consistency, mobility, non-tender and anteverted              Adnexa: normal adnexa and no mass, fullness, tenderness               Rectovaginal: Confirms               Anus:  normal sphincter tone, no lesions  A:  Well Woman with normal exam  Menopausal no HRT  Hypothyroid/Hyperlipidemia on stable medication with PCP management  Vaginal dryness OTC Replens working well  P:   Reviewed health and wellness pertinent to exam  Aware of need to evaluate if vaginal bleeding  Continue  follow up as indicated  Advise if this is not working  Pap smear as per guidelines   Mammogram yearly pap smear taken today  counseled on breast self exam, mammography screening, STD prevention, adequate intake of calcium and vitamin D, diet and exercise  return annually or prn  An After Visit Summary was printed and given to the patient.

## 2013-08-13 LAB — IPS PAP SMEAR ONLY

## 2013-08-18 ENCOUNTER — Other Ambulatory Visit: Payer: Self-pay | Admitting: Internal Medicine

## 2013-08-18 DIAGNOSIS — E041 Nontoxic single thyroid nodule: Secondary | ICD-10-CM | POA: Diagnosis not present

## 2013-08-18 DIAGNOSIS — J301 Allergic rhinitis due to pollen: Secondary | ICD-10-CM | POA: Diagnosis not present

## 2013-08-18 DIAGNOSIS — E039 Hypothyroidism, unspecified: Secondary | ICD-10-CM | POA: Diagnosis not present

## 2013-08-18 DIAGNOSIS — K5900 Constipation, unspecified: Secondary | ICD-10-CM | POA: Diagnosis not present

## 2013-08-18 DIAGNOSIS — Z1331 Encounter for screening for depression: Secondary | ICD-10-CM | POA: Diagnosis not present

## 2013-08-18 DIAGNOSIS — E785 Hyperlipidemia, unspecified: Secondary | ICD-10-CM | POA: Diagnosis not present

## 2013-08-18 DIAGNOSIS — Z0000 Encounter for general adult medical examination without abnormal findings: Secondary | ICD-10-CM | POA: Diagnosis not present

## 2013-08-18 DIAGNOSIS — R0989 Other specified symptoms and signs involving the circulatory and respiratory systems: Secondary | ICD-10-CM | POA: Diagnosis not present

## 2013-08-18 DIAGNOSIS — R809 Proteinuria, unspecified: Secondary | ICD-10-CM | POA: Diagnosis not present

## 2013-08-24 ENCOUNTER — Ambulatory Visit
Admission: RE | Admit: 2013-08-24 | Discharge: 2013-08-24 | Disposition: A | Discharge status: Home / Self Care | Payer: Medicare Other | Source: Ambulatory Visit | Attending: Internal Medicine | Admitting: Internal Medicine

## 2013-08-24 DIAGNOSIS — Z1212 Encounter for screening for malignant neoplasm of rectum: Secondary | ICD-10-CM | POA: Diagnosis not present

## 2013-08-24 DIAGNOSIS — E041 Nontoxic single thyroid nodule: Secondary | ICD-10-CM

## 2013-09-03 DIAGNOSIS — Z1231 Encounter for screening mammogram for malignant neoplasm of breast: Secondary | ICD-10-CM | POA: Diagnosis not present

## 2014-03-24 DIAGNOSIS — Z23 Encounter for immunization: Secondary | ICD-10-CM | POA: Diagnosis not present

## 2014-04-12 ENCOUNTER — Encounter: Payer: Self-pay | Admitting: Certified Nurse Midwife

## 2014-07-15 ENCOUNTER — Other Ambulatory Visit: Payer: Self-pay | Admitting: Dermatology

## 2014-07-15 DIAGNOSIS — D485 Neoplasm of uncertain behavior of skin: Secondary | ICD-10-CM | POA: Diagnosis not present

## 2014-07-15 DIAGNOSIS — C44319 Basal cell carcinoma of skin of other parts of face: Secondary | ICD-10-CM | POA: Diagnosis not present

## 2014-07-15 DIAGNOSIS — Z85828 Personal history of other malignant neoplasm of skin: Secondary | ICD-10-CM | POA: Diagnosis not present

## 2014-07-15 DIAGNOSIS — D225 Melanocytic nevi of trunk: Secondary | ICD-10-CM | POA: Diagnosis not present

## 2014-07-15 DIAGNOSIS — L570 Actinic keratosis: Secondary | ICD-10-CM | POA: Diagnosis not present

## 2014-07-15 DIAGNOSIS — D2261 Melanocytic nevi of right upper limb, including shoulder: Secondary | ICD-10-CM | POA: Diagnosis not present

## 2014-07-15 DIAGNOSIS — L820 Inflamed seborrheic keratosis: Secondary | ICD-10-CM | POA: Diagnosis not present

## 2014-07-15 DIAGNOSIS — D2262 Melanocytic nevi of left upper limb, including shoulder: Secondary | ICD-10-CM | POA: Diagnosis not present

## 2014-07-15 DIAGNOSIS — D2272 Melanocytic nevi of left lower limb, including hip: Secondary | ICD-10-CM | POA: Diagnosis not present

## 2014-07-15 DIAGNOSIS — D2271 Melanocytic nevi of right lower limb, including hip: Secondary | ICD-10-CM | POA: Diagnosis not present

## 2014-07-15 DIAGNOSIS — L859 Epidermal thickening, unspecified: Secondary | ICD-10-CM | POA: Diagnosis not present

## 2014-07-15 DIAGNOSIS — L814 Other melanin hyperpigmentation: Secondary | ICD-10-CM | POA: Diagnosis not present

## 2014-07-15 DIAGNOSIS — L821 Other seborrheic keratosis: Secondary | ICD-10-CM | POA: Diagnosis not present

## 2014-07-22 DIAGNOSIS — J302 Other seasonal allergic rhinitis: Secondary | ICD-10-CM | POA: Diagnosis not present

## 2014-07-22 DIAGNOSIS — H1032 Unspecified acute conjunctivitis, left eye: Secondary | ICD-10-CM | POA: Diagnosis not present

## 2014-07-22 DIAGNOSIS — Z6830 Body mass index (BMI) 30.0-30.9, adult: Secondary | ICD-10-CM | POA: Diagnosis not present

## 2014-07-22 DIAGNOSIS — J0100 Acute maxillary sinusitis, unspecified: Secondary | ICD-10-CM | POA: Diagnosis not present

## 2014-08-12 DIAGNOSIS — C44319 Basal cell carcinoma of skin of other parts of face: Secondary | ICD-10-CM | POA: Diagnosis not present

## 2014-08-12 DIAGNOSIS — Z85828 Personal history of other malignant neoplasm of skin: Secondary | ICD-10-CM | POA: Diagnosis not present

## 2014-08-13 ENCOUNTER — Telehealth: Payer: Self-pay | Admitting: Certified Nurse Midwife

## 2014-08-13 ENCOUNTER — Ambulatory Visit: Payer: Medicare Other | Admitting: Certified Nurse Midwife

## 2014-08-13 NOTE — Telephone Encounter (Signed)
Patient canceled her appointment today due to nausea, patient rescheduled to 08/16/14.

## 2014-08-16 ENCOUNTER — Encounter: Payer: Self-pay | Admitting: Certified Nurse Midwife

## 2014-08-16 ENCOUNTER — Ambulatory Visit (INDEPENDENT_AMBULATORY_CARE_PROVIDER_SITE_OTHER): Payer: Medicare Other | Admitting: Certified Nurse Midwife

## 2014-08-16 VITALS — BP 118/74 | HR 70 | Resp 16 | Ht 65.75 in | Wt 181.0 lb

## 2014-08-16 DIAGNOSIS — Z124 Encounter for screening for malignant neoplasm of cervix: Secondary | ICD-10-CM | POA: Diagnosis not present

## 2014-08-16 DIAGNOSIS — Z01419 Encounter for gynecological examination (general) (routine) without abnormal findings: Secondary | ICD-10-CM | POA: Diagnosis not present

## 2014-08-16 NOTE — Progress Notes (Signed)
64 y.o. G30P2002 Divorced  Caucasian Fe here for annual exam. Menopausal no HRT. Denies any vaginal bleeding.  Treating vaginal dryness with Replens. Recent basal cancer on face removal with Mose procedure.  Had a hard year in Sep 17, 2013, partner died, daughter diagnosed with blood clot in pelvic area, which required surgery also diagnosed. Also she was diagnosed with Lyme disease. Struggling to cope day by day, but feels she is doing better now. Recent move to new house also. Denies any medical concerns other than with other Mose surgery which she is calling today on.   Patient's last menstrual period was 06/12/1999.          Sexually active: No.  The current method of family planning is tubal ligation.    Exercising: No.  exercise Smoker:  no  Health Maintenance: Pap: 08-11-13 neg MMG:  09/17/13 neg per patient Colonoscopy:  09-17-05 due 2017,pcp does ifob BMD:   2009-09-17 TDaP:  Sep 17, 2005 Labs: pcp Self breast exam: done occ   reports that she has never smoked. She does not have any smokeless tobacco history on file. She reports that she drinks alcohol. She reports that she does not use illicit drugs.  Past Medical History  Diagnosis Date  . Fibroid     small  . Migraines     with aura  . Thyroid disease     hypothyroid  . Cancer     skin, basal cell (forehead)    Past Surgical History  Procedure Laterality Date  . Umbilical hernia repair      with BTL  . Tubal ligation    . Thyroid  nodule aspirated  4/12    cyst benign    Current Outpatient Prescriptions  Medication Sig Dispense Refill  . atorvastatin (LIPITOR) 10 MG tablet Take 10 mg by mouth daily.    Marland Kitchen BIOTIN PO Take by mouth daily.    Marland Kitchen doxycycline (VIBRAMYCIN) 100 MG capsule Take 100 mg by mouth 2 (two) times daily.  0  . levothyroxine (SYNTHROID, LEVOTHROID) 88 MCG tablet Take 88 mcg by mouth daily before breakfast.    . Multiple Vitamins-Minerals (MULTIVITAMIN PO) Take by mouth daily.    Marland Kitchen aspirin 81 MG tablet Take 81 mg by mouth  daily.     No current facility-administered medications for this visit.    Family History  Problem Relation Age of Onset  . Other Mother     brain tumor    ROS:  Pertinent items are noted in HPI.  Otherwise, a comprehensive ROS was negative.  Exam:   BP 118/74 mmHg  Pulse 70  Resp 16  Ht 5' 5.75" (1.67 m)  Wt 181 lb (82.101 kg)  BMI 29.44 kg/m2  LMP 06/12/1999 Height: 5' 5.75" (167 cm) Ht Readings from Last 3 Encounters:  08/16/14 5' 5.75" (1.67 m)  08/11/13 5' 5.5" (1.664 m)  12/16/08 5\' 7"  (1.702 m)    General appearance: alert, cooperative and appears stated age Head: Normocephalic, without obvious abnormality, atraumatic Neck: no adenopathy, supple, symmetrical, trachea midline and thyroid normal to inspection and palpation Lungs: clear to auscultation bilaterally Breasts: normal appearance, no masses or tenderness, No nipple retraction or dimpling, No nipple discharge or bleeding, No axillary or supraclavicular adenopathy Heart: regular rate and rhythm Abdomen: soft, non-tender; no masses,  no organomegaly Extremities: extremities normal, atraumatic, no cyanosis or edema Skin: Skin color, texture, turgor normal. No rashes or lesions Lymph nodes: Cervical, supraclavicular, and axillary nodes normal. No abnormal inguinal nodes palpated Neurologic: Grossly  normal   Pelvic: External genitalia:  no lesions              Urethra:  normal appearing urethra with no masses, tenderness or lesions              Bartholin's and Skene's: normal                 Vagina: normal appearing vagina with normal color and discharge, no lesions              Cervix: normal, non tender, no  lesions              Pap taken: No. Bimanual Exam:  Uterus:  normal size, contour, position, consistency, mobility, non-tender              Adnexa: normal adnexa and no mass, fullness, tenderness               Rectovaginal: Confirms               Anus:  normal sphincter tone, no lesions    A:  Well  Woman with normal exam  Menopausal no HRT  Hypothyroid stable per patient with PCP management  Social stress  Recent facial moses surgery for basal cell cancer  P:   Reviewed health and wellness pertinent to exam  Aware of need to evaluate if vaginal bleeding  Discussed Hospice grief counseling, individual or group availability. Seek friend and family support and take time for self.  Continue follow up as indicated.  Pap smear taken today with HPV reflex   counseled on breast self exam, mammography screening, osteoporosis with BMD which is due( patient to schedule), adequate intake of calcium and vitamin D, diet and exercise, Kegel's exercises  return annually or prn  An After Visit Summary was printed and given to the patient.

## 2014-08-16 NOTE — Patient Instructions (Signed)

## 2014-08-17 NOTE — Progress Notes (Signed)
Reviewed personally.  M. Suzanne Camylle Whicker, MD.  

## 2014-08-19 DIAGNOSIS — L570 Actinic keratosis: Secondary | ICD-10-CM | POA: Diagnosis not present

## 2014-08-24 DIAGNOSIS — E039 Hypothyroidism, unspecified: Secondary | ICD-10-CM | POA: Diagnosis not present

## 2014-08-24 DIAGNOSIS — E785 Hyperlipidemia, unspecified: Secondary | ICD-10-CM | POA: Diagnosis not present

## 2014-08-31 DIAGNOSIS — E039 Hypothyroidism, unspecified: Secondary | ICD-10-CM | POA: Diagnosis not present

## 2014-08-31 DIAGNOSIS — J302 Other seasonal allergic rhinitis: Secondary | ICD-10-CM | POA: Diagnosis not present

## 2014-08-31 DIAGNOSIS — Z6829 Body mass index (BMI) 29.0-29.9, adult: Secondary | ICD-10-CM | POA: Diagnosis not present

## 2014-08-31 DIAGNOSIS — Z1389 Encounter for screening for other disorder: Secondary | ICD-10-CM | POA: Diagnosis not present

## 2014-08-31 DIAGNOSIS — E041 Nontoxic single thyroid nodule: Secondary | ICD-10-CM | POA: Diagnosis not present

## 2014-08-31 DIAGNOSIS — Z0000 Encounter for general adult medical examination without abnormal findings: Secondary | ICD-10-CM | POA: Diagnosis not present

## 2014-08-31 DIAGNOSIS — R0989 Other specified symptoms and signs involving the circulatory and respiratory systems: Secondary | ICD-10-CM | POA: Diagnosis not present

## 2014-08-31 DIAGNOSIS — B349 Viral infection, unspecified: Secondary | ICD-10-CM | POA: Diagnosis not present

## 2014-08-31 DIAGNOSIS — E785 Hyperlipidemia, unspecified: Secondary | ICD-10-CM | POA: Diagnosis not present

## 2014-08-31 DIAGNOSIS — K5900 Constipation, unspecified: Secondary | ICD-10-CM | POA: Diagnosis not present

## 2014-09-01 ENCOUNTER — Telehealth: Payer: Self-pay | Admitting: Certified Nurse Midwife

## 2014-09-01 DIAGNOSIS — Z1212 Encounter for screening for malignant neoplasm of rectum: Secondary | ICD-10-CM | POA: Diagnosis not present

## 2014-09-01 NOTE — Telephone Encounter (Signed)
Order for BMD to Regina Eck CNM for review and signature before fax.

## 2014-09-01 NOTE — Telephone Encounter (Signed)
Spoke with patient. Advised signed order has been faxed to [facility] for BMD appointment. Faxed with cover sheet and confirmation. Patient is agreeable.  Routing to provider for final review. Patient agreeable to disposition. Will close encounter

## 2014-09-01 NOTE — Telephone Encounter (Signed)
Patient is calling to get an bone density order sent to [facility]. She has an appointment on 09/16/14

## 2014-09-16 DIAGNOSIS — R0989 Other specified symptoms and signs involving the circulatory and respiratory systems: Secondary | ICD-10-CM | POA: Diagnosis not present

## 2014-09-16 DIAGNOSIS — Z1231 Encounter for screening mammogram for malignant neoplasm of breast: Secondary | ICD-10-CM | POA: Diagnosis not present

## 2014-09-16 DIAGNOSIS — Z780 Asymptomatic menopausal state: Secondary | ICD-10-CM | POA: Diagnosis not present

## 2014-09-29 ENCOUNTER — Telehealth: Payer: Self-pay

## 2014-09-29 NOTE — Telephone Encounter (Signed)
Pt notified of bmd results. See scan

## 2014-12-22 DIAGNOSIS — E039 Hypothyroidism, unspecified: Secondary | ICD-10-CM | POA: Diagnosis not present

## 2014-12-22 DIAGNOSIS — L237 Allergic contact dermatitis due to plants, except food: Secondary | ICD-10-CM | POA: Diagnosis not present

## 2014-12-22 DIAGNOSIS — Z6829 Body mass index (BMI) 29.0-29.9, adult: Secondary | ICD-10-CM | POA: Diagnosis not present

## 2014-12-22 DIAGNOSIS — H1032 Unspecified acute conjunctivitis, left eye: Secondary | ICD-10-CM | POA: Diagnosis not present

## 2014-12-22 DIAGNOSIS — R600 Localized edema: Secondary | ICD-10-CM | POA: Diagnosis not present

## 2015-01-02 DIAGNOSIS — R21 Rash and other nonspecific skin eruption: Secondary | ICD-10-CM | POA: Diagnosis not present

## 2015-03-15 DIAGNOSIS — M79644 Pain in right finger(s): Secondary | ICD-10-CM | POA: Diagnosis not present

## 2015-03-24 DIAGNOSIS — M19041 Primary osteoarthritis, right hand: Secondary | ICD-10-CM | POA: Diagnosis not present

## 2015-04-01 DIAGNOSIS — Z85828 Personal history of other malignant neoplasm of skin: Secondary | ICD-10-CM | POA: Diagnosis not present

## 2015-04-01 DIAGNOSIS — L820 Inflamed seborrheic keratosis: Secondary | ICD-10-CM | POA: Diagnosis not present

## 2015-04-01 DIAGNOSIS — D485 Neoplasm of uncertain behavior of skin: Secondary | ICD-10-CM | POA: Diagnosis not present

## 2015-04-01 DIAGNOSIS — C44519 Basal cell carcinoma of skin of other part of trunk: Secondary | ICD-10-CM | POA: Diagnosis not present

## 2015-04-01 DIAGNOSIS — B078 Other viral warts: Secondary | ICD-10-CM | POA: Diagnosis not present

## 2015-05-24 DIAGNOSIS — Z23 Encounter for immunization: Secondary | ICD-10-CM | POA: Diagnosis not present

## 2015-07-01 IMAGING — US US SOFT TISSUE HEAD/NECK
1 series · 14 of 25 positions shown · non-contrast
Comparison: 10/04/2010 and earlier studies

CLINICAL DATA: Thyroid nodule. Previous FNA biopsy right cystic
lesion 10/04/2010.

EXAM:
THYROID ULTRASOUND
TECHNIQUE: Ultrasound examination of the thyroid gland and adjacent soft
tissues was performed.

[Series 1: us soft tissue head/neck · 0.08mm/px · 14 of 39 slices shown]
[im 1/39]
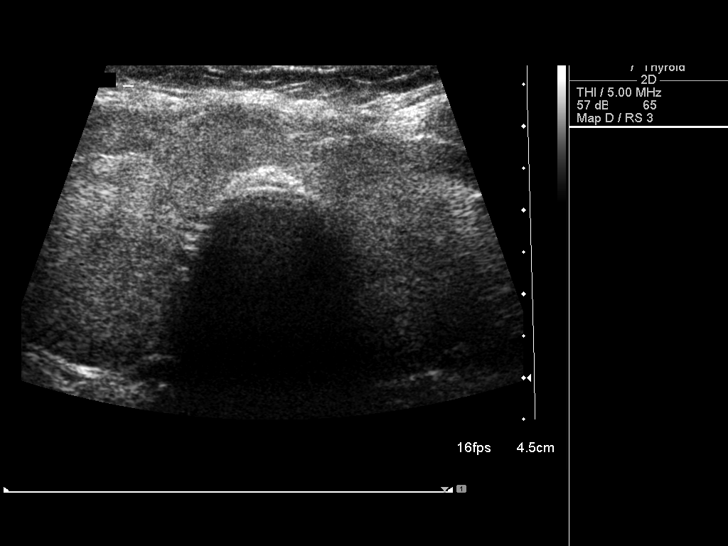
[im 4/39]
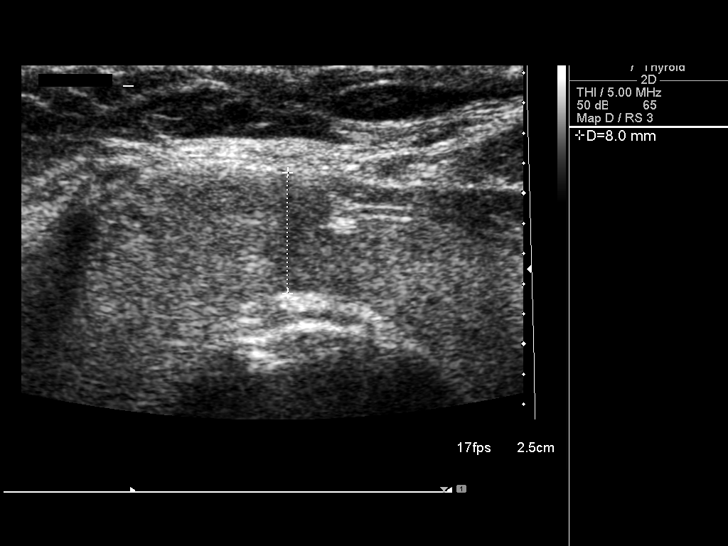
[im 7/39]
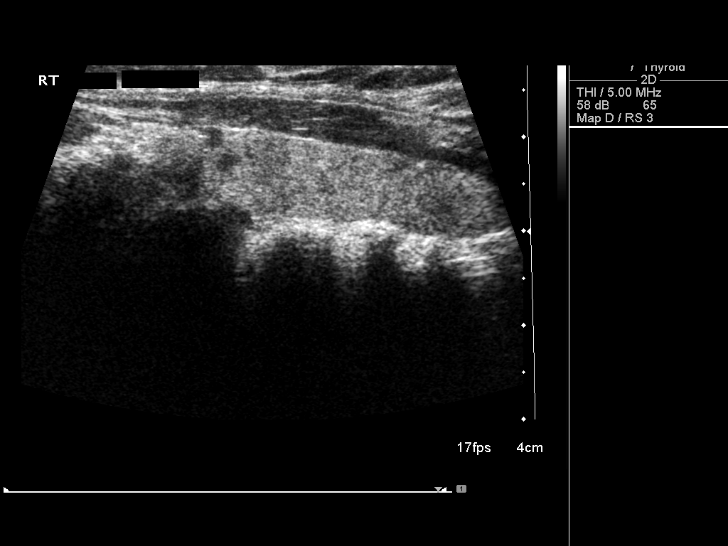
[im 10/39]
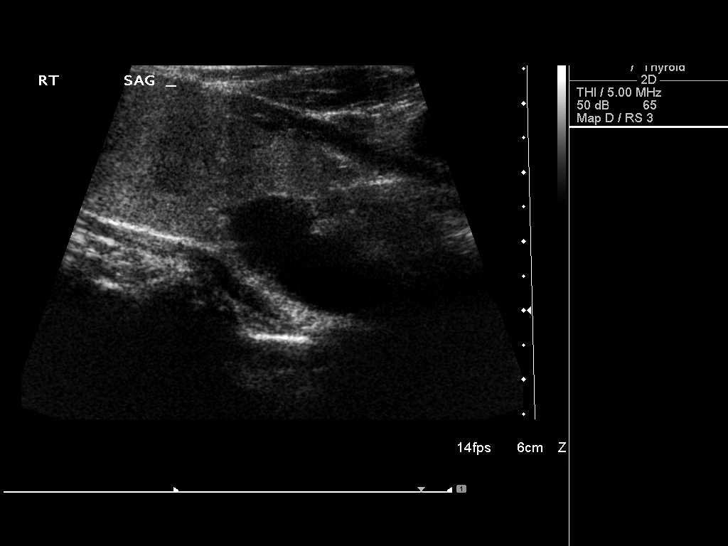
[im 13/39]
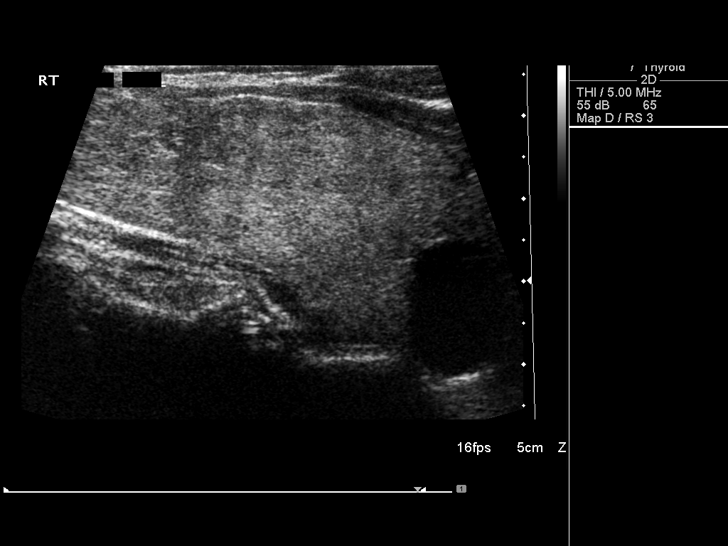
[im 15/39]
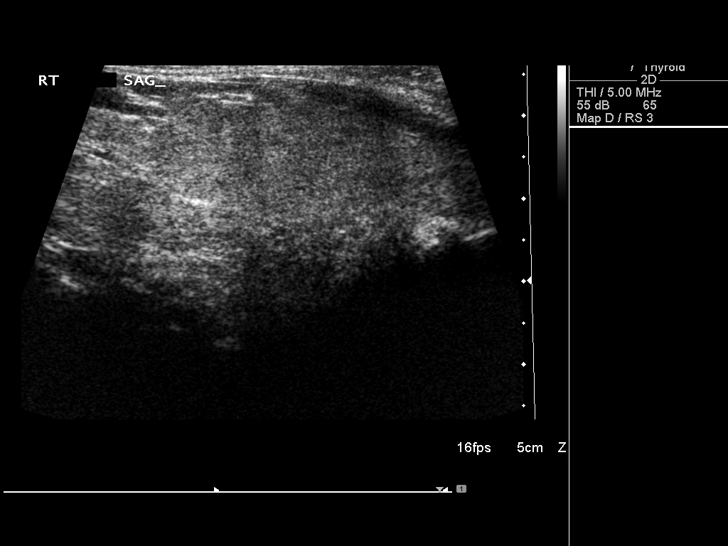
[im 18/39]
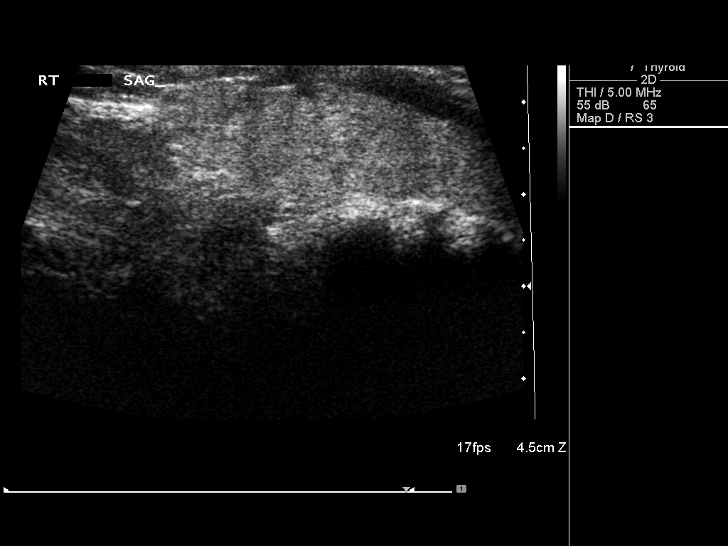
[im 21/39]
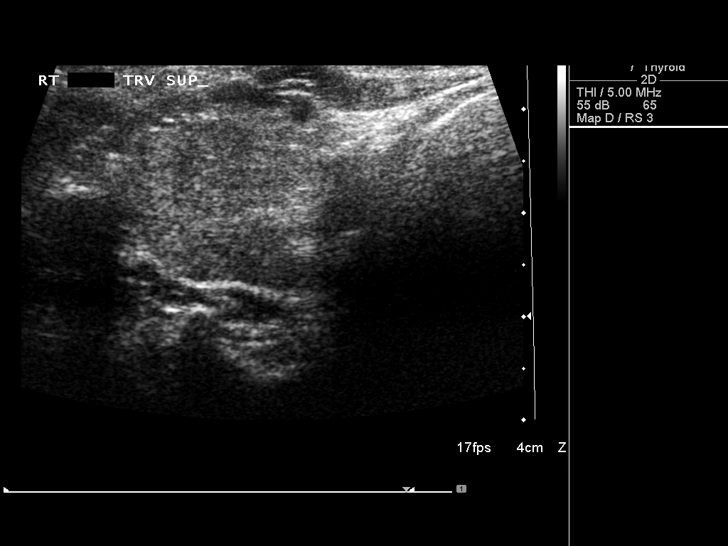
[im 24/39]
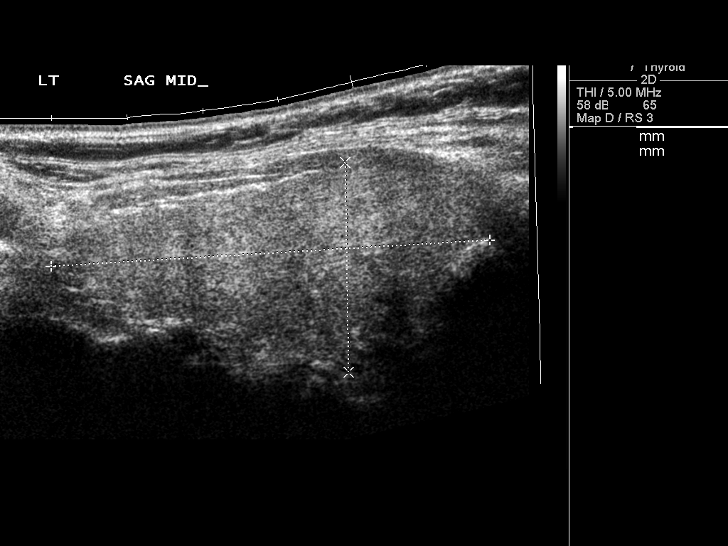
[im 26/39]
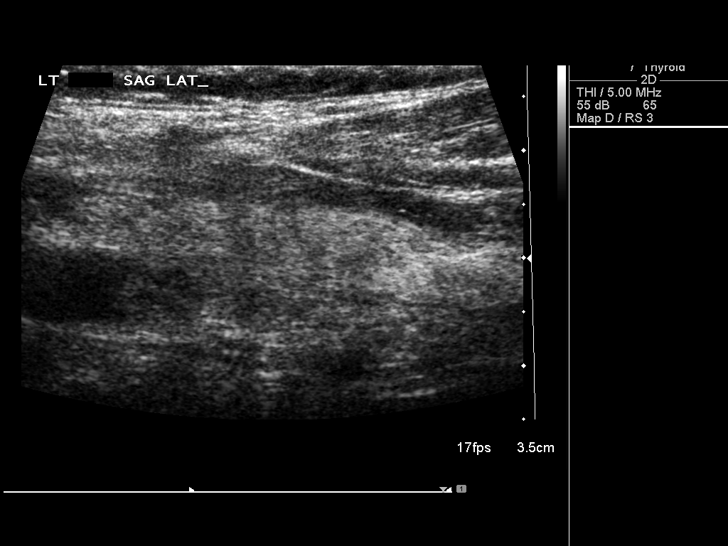
[im 29/39]
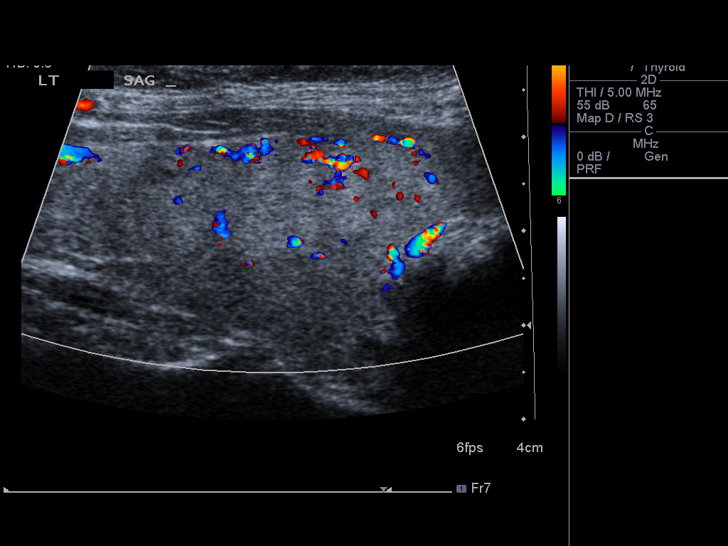
[im 32/39]
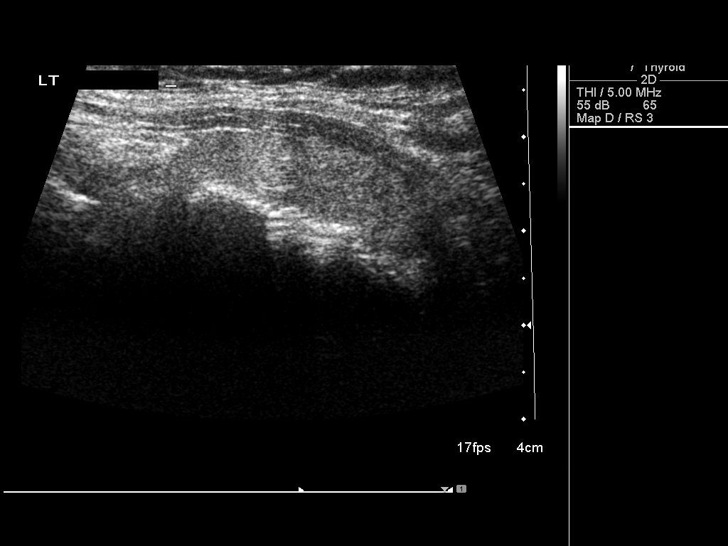
[im 35/39]
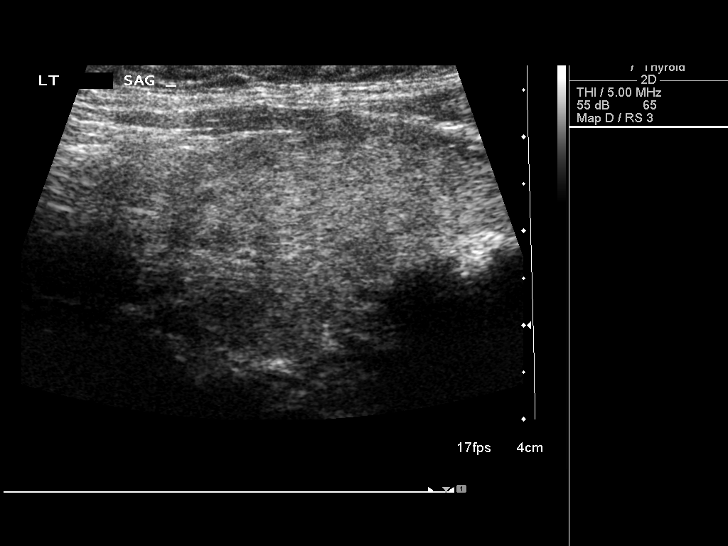
[im 39/39]
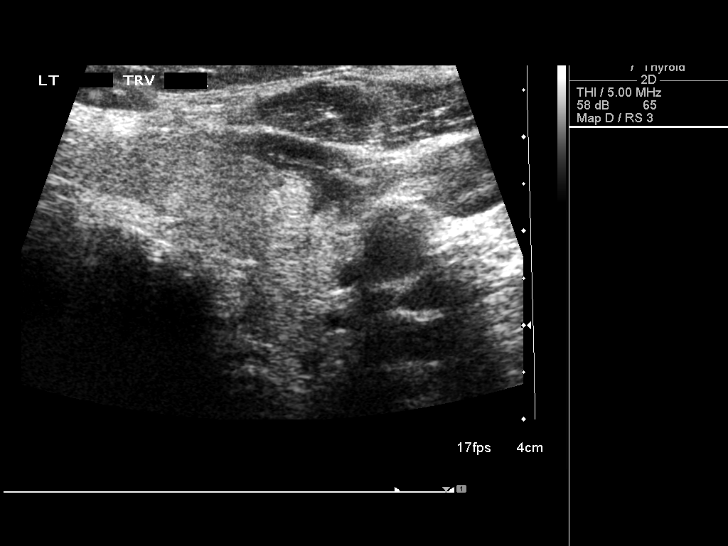

[14 of 25 positions shown; findings below may reference images not displayed]

FINDINGS: Right thyroid lobe

Measurements: 63 x 27 x 31 mm. 27 x 12 x 15 mm bilobed cystic
lesion, inferior pole (previously 30 x 17 x 16)

Left thyroid lobe

Measurements: 58 x 28 x 20 mm.  No nodules visualized.

Isthmus

Thickness: 8 mm.  No nodules visualized.

Lymphadenopathy

None visualized.
IMPRESSION: 1. Thyromegaly.
2. Interval decrease in size of solitary cystic right thyroid
lesion. Correlate with previous biopsy results.

## 2015-08-05 DIAGNOSIS — C44519 Basal cell carcinoma of skin of other part of trunk: Secondary | ICD-10-CM | POA: Diagnosis not present

## 2015-08-05 DIAGNOSIS — Z85828 Personal history of other malignant neoplasm of skin: Secondary | ICD-10-CM | POA: Diagnosis not present

## 2015-08-05 DIAGNOSIS — D485 Neoplasm of uncertain behavior of skin: Secondary | ICD-10-CM | POA: Diagnosis not present

## 2015-08-18 ENCOUNTER — Ambulatory Visit (INDEPENDENT_AMBULATORY_CARE_PROVIDER_SITE_OTHER): Payer: Medicare Other | Admitting: Certified Nurse Midwife

## 2015-08-18 ENCOUNTER — Encounter: Payer: Self-pay | Admitting: Certified Nurse Midwife

## 2015-08-18 VITALS — BP 114/70 | HR 72 | Resp 16 | Ht 65.75 in | Wt 188.0 lb

## 2015-08-18 DIAGNOSIS — Z01419 Encounter for gynecological examination (general) (routine) without abnormal findings: Secondary | ICD-10-CM

## 2015-08-18 DIAGNOSIS — N816 Rectocele: Secondary | ICD-10-CM

## 2015-08-18 DIAGNOSIS — N951 Menopausal and female climacteric states: Secondary | ICD-10-CM | POA: Diagnosis not present

## 2015-08-18 DIAGNOSIS — Z124 Encounter for screening for malignant neoplasm of cervix: Secondary | ICD-10-CM

## 2015-08-18 NOTE — Patient Instructions (Signed)

## 2015-08-18 NOTE — Progress Notes (Signed)
65 y.o. G45P2002 Divorced  Caucasian Fe here for annual exam. Menopausal no HRT.  Denies vaginal bleeding.Some vaginal dryness. Sees Dr.Ava for aex/labs/ Hypothyroid/ Cholesterol management. No medication change. Staying busy with work. No other health issues today. Needs a vacation!  Patient's last menstrual period was 06/12/1999.          Sexually active: No.  The current method of family planning is tubal ligation.    Exercising: No.  exercise Smoker:  no  Health Maintenance: Pap:  08-11-13 neg MMG:  09-16-14 category c density,birads 1:neg Colonoscopy:  2007 due 2017, pcp does ifob BMD:   2016 TDaP:  2007, pt to check pcp Shingles: 2016 Pneumonia: 2016 Hep C and HIV: not done Labs: pcp Self breast exam: done occ   reports that she has never smoked. She does not have any smokeless tobacco history on file. She reports that she does not drink alcohol or use illicit drugs.  Past Medical History  Diagnosis Date  . Fibroid     small  . Migraines     with aura  . Thyroid disease     hypothyroid  . Cancer ([place])     skin, basal cell (forehead)    Past Surgical History  Procedure Laterality Date  . Umbilical hernia repair      with BTL  . Tubal ligation    . Thyroid  nodule aspirated  4/12    cyst benign    Current Outpatient Prescriptions  Medication Sig Dispense Refill  . atorvastatin (LIPITOR) 10 MG tablet Take 10 mg by mouth daily.    Marland Kitchen levothyroxine (SYNTHROID, LEVOTHROID) 88 MCG tablet Take 88 mcg by mouth daily before breakfast.    . valACYclovir (VALTREX) 500 MG tablet take 2 tablets by mouth if needed  0   No current facility-administered medications for this visit.    Family History  Problem Relation Age of Onset  . Other Mother     brain tumor    ROS:  Pertinent items are noted in HPI.  Otherwise, a comprehensive ROS was negative.  Exam:   BP 114/70 mmHg  Pulse 72  Resp 16  Ht 5' 5.75" (1.67 m)  Wt 188 lb (85.276 kg)  BMI 30.58 kg/m2  LMP 06/12/1999  Height: 5' 5.75" (167 cm) Ht Readings from Last 3 Encounters:  08/18/15 5' 5.75" (1.67 m)  08/16/14 5' 5.75" (1.67 m)  08/11/13 5' 5.5" (1.664 m)    General appearance: alert, cooperative and appears stated age Head: Normocephalic, without obvious abnormality, atraumatic Neck: no adenopathy, supple, , trachea midline and thyroid enlarged, known enlargement Lungs: clear to auscultation bilaterally Breasts: normal appearance, no masses or tenderness, No nipple retraction or dimpling, No nipple discharge or bleeding, No axillary or supraclavicular adenopathy Heart: regular rate and rhythm Abdomen: soft, non-tender; no masses,  no organomegaly Extremities: extremities normal, atraumatic, no cyanosis or edema Skin: Skin color, texture, turgor normal. No rashes or lesions Lymph nodes: Cervical, supraclavicular, and axillary nodes normal. No abnormal inguinal nodes palpated Neurologic: Grossly normal   Pelvic: External genitalia:  no lesions              Urethra:  normal appearing urethra with no masses, tenderness or lesions              Bartholin's and Skene's: normal                 Vagina: normal appearing vagina with normal color and discharge, no lesions with grade 2 rectocele  not symptomatic              Cervix: normal,non tender,no lesions              Pap taken: Yes.   Bimanual Exam:  Uterus:  normal size, contour, position, consistency, mobility, non-tender              Adnexa: normal adnexa and no mass, fullness, tenderness               Rectovaginal: Confirms               Anus:  normal sphincter tone, no lesions  Chaperone present: yes  A:  Well Woman with normal exam  Menopausal no HRT  Hypothyroid/cholesterol management with PCP all stable per patient  Vaginal dryness  Rectocele asymptomatic  P:   Reviewed health and wellness pertinent to exam  Aware of need to evaluate if vaginal bleeding  Continue follow up with PCP as indicated  Discussed coconut oil use for  dryness, instructions given, will advise if no change  Discussed finding and etiology. Discussed importance of kegel exercise and normal bowel habits. She is having no issues at this point and will advise if concerns.  Pap smear as above    counseled on breast self exam, mammography screening, adequate intake of calcium and vitamin D, diet and exercise, Kegel's exercises  return annually or prn  An After Visit Summary was printed and given to the patient.

## 2015-08-19 DIAGNOSIS — C44519 Basal cell carcinoma of skin of other part of trunk: Secondary | ICD-10-CM | POA: Diagnosis not present

## 2015-08-19 DIAGNOSIS — Z85828 Personal history of other malignant neoplasm of skin: Secondary | ICD-10-CM | POA: Diagnosis not present

## 2015-08-21 LAB — IPS PAP SMEAR ONLY

## 2015-08-22 NOTE — Progress Notes (Signed)
Encounter reviewed Jill Jertson, MD   

## 2015-09-12 DIAGNOSIS — R8299 Other abnormal findings in urine: Secondary | ICD-10-CM | POA: Diagnosis not present

## 2015-09-12 DIAGNOSIS — E039 Hypothyroidism, unspecified: Secondary | ICD-10-CM | POA: Diagnosis not present

## 2015-09-12 DIAGNOSIS — E784 Other hyperlipidemia: Secondary | ICD-10-CM | POA: Diagnosis not present

## 2015-09-12 DIAGNOSIS — N390 Urinary tract infection, site not specified: Secondary | ICD-10-CM | POA: Diagnosis not present

## 2015-09-15 DIAGNOSIS — L814 Other melanin hyperpigmentation: Secondary | ICD-10-CM | POA: Diagnosis not present

## 2015-09-15 DIAGNOSIS — D1801 Hemangioma of skin and subcutaneous tissue: Secondary | ICD-10-CM | POA: Diagnosis not present

## 2015-09-15 DIAGNOSIS — D225 Melanocytic nevi of trunk: Secondary | ICD-10-CM | POA: Diagnosis not present

## 2015-09-15 DIAGNOSIS — D2272 Melanocytic nevi of left lower limb, including hip: Secondary | ICD-10-CM | POA: Diagnosis not present

## 2015-09-15 DIAGNOSIS — L821 Other seborrheic keratosis: Secondary | ICD-10-CM | POA: Diagnosis not present

## 2015-09-15 DIAGNOSIS — D2261 Melanocytic nevi of right upper limb, including shoulder: Secondary | ICD-10-CM | POA: Diagnosis not present

## 2015-09-15 DIAGNOSIS — Z85828 Personal history of other malignant neoplasm of skin: Secondary | ICD-10-CM | POA: Diagnosis not present

## 2015-09-15 DIAGNOSIS — D2271 Melanocytic nevi of right lower limb, including hip: Secondary | ICD-10-CM | POA: Diagnosis not present

## 2015-09-19 DIAGNOSIS — R0989 Other specified symptoms and signs involving the circulatory and respiratory systems: Secondary | ICD-10-CM | POA: Diagnosis not present

## 2015-09-19 DIAGNOSIS — R1011 Right upper quadrant pain: Secondary | ICD-10-CM | POA: Diagnosis not present

## 2015-09-19 DIAGNOSIS — Z1389 Encounter for screening for other disorder: Secondary | ICD-10-CM | POA: Diagnosis not present

## 2015-09-19 DIAGNOSIS — Z0000 Encounter for general adult medical examination without abnormal findings: Secondary | ICD-10-CM | POA: Diagnosis not present

## 2015-09-19 DIAGNOSIS — H269 Unspecified cataract: Secondary | ICD-10-CM | POA: Diagnosis not present

## 2015-09-19 DIAGNOSIS — E039 Hypothyroidism, unspecified: Secondary | ICD-10-CM | POA: Diagnosis not present

## 2015-09-19 DIAGNOSIS — R600 Localized edema: Secondary | ICD-10-CM | POA: Diagnosis not present

## 2015-09-19 DIAGNOSIS — E784 Other hyperlipidemia: Secondary | ICD-10-CM | POA: Diagnosis not present

## 2015-09-19 DIAGNOSIS — E041 Nontoxic single thyroid nodule: Secondary | ICD-10-CM | POA: Diagnosis not present

## 2015-09-19 DIAGNOSIS — J302 Other seasonal allergic rhinitis: Secondary | ICD-10-CM | POA: Diagnosis not present

## 2015-09-19 DIAGNOSIS — Z6830 Body mass index (BMI) 30.0-30.9, adult: Secondary | ICD-10-CM | POA: Diagnosis not present

## 2015-09-19 DIAGNOSIS — N390 Urinary tract infection, site not specified: Secondary | ICD-10-CM | POA: Diagnosis not present

## 2015-10-25 DIAGNOSIS — Z1231 Encounter for screening mammogram for malignant neoplasm of breast: Secondary | ICD-10-CM | POA: Diagnosis not present

## 2015-10-27 DIAGNOSIS — R928 Other abnormal and inconclusive findings on diagnostic imaging of breast: Secondary | ICD-10-CM | POA: Diagnosis not present

## 2015-10-27 DIAGNOSIS — R922 Inconclusive mammogram: Secondary | ICD-10-CM | POA: Diagnosis not present

## 2015-11-24 DIAGNOSIS — Z1212 Encounter for screening for malignant neoplasm of rectum: Secondary | ICD-10-CM | POA: Diagnosis not present

## 2015-11-29 DIAGNOSIS — H18411 Arcus senilis, right eye: Secondary | ICD-10-CM | POA: Diagnosis not present

## 2015-11-29 DIAGNOSIS — H2512 Age-related nuclear cataract, left eye: Secondary | ICD-10-CM | POA: Diagnosis not present

## 2015-11-29 DIAGNOSIS — H2511 Age-related nuclear cataract, right eye: Secondary | ICD-10-CM | POA: Diagnosis not present

## 2015-11-29 DIAGNOSIS — H02839 Dermatochalasis of unspecified eye, unspecified eyelid: Secondary | ICD-10-CM | POA: Diagnosis not present

## 2015-11-29 DIAGNOSIS — H18412 Arcus senilis, left eye: Secondary | ICD-10-CM | POA: Diagnosis not present

## 2015-12-16 DIAGNOSIS — H2512 Age-related nuclear cataract, left eye: Secondary | ICD-10-CM | POA: Diagnosis not present

## 2015-12-16 DIAGNOSIS — H2511 Age-related nuclear cataract, right eye: Secondary | ICD-10-CM | POA: Diagnosis not present

## 2015-12-26 DIAGNOSIS — H2511 Age-related nuclear cataract, right eye: Secondary | ICD-10-CM | POA: Diagnosis not present

## 2016-04-17 ENCOUNTER — Encounter: Payer: Self-pay | Admitting: Gastroenterology

## 2016-04-25 DIAGNOSIS — L718 Other rosacea: Secondary | ICD-10-CM | POA: Diagnosis not present

## 2016-04-25 DIAGNOSIS — L239 Allergic contact dermatitis, unspecified cause: Secondary | ICD-10-CM | POA: Diagnosis not present

## 2016-04-25 DIAGNOSIS — Z85828 Personal history of other malignant neoplasm of skin: Secondary | ICD-10-CM | POA: Diagnosis not present

## 2016-05-01 DIAGNOSIS — N6002 Solitary cyst of left breast: Secondary | ICD-10-CM | POA: Diagnosis not present

## 2016-05-01 DIAGNOSIS — N6489 Other specified disorders of breast: Secondary | ICD-10-CM | POA: Diagnosis not present

## 2016-05-14 DIAGNOSIS — R0989 Other specified symptoms and signs involving the circulatory and respiratory systems: Secondary | ICD-10-CM | POA: Diagnosis not present

## 2016-05-14 DIAGNOSIS — M542 Cervicalgia: Secondary | ICD-10-CM | POA: Diagnosis not present

## 2016-05-14 DIAGNOSIS — Z6830 Body mass index (BMI) 30.0-30.9, adult: Secondary | ICD-10-CM | POA: Diagnosis not present

## 2016-06-12 DIAGNOSIS — Z23 Encounter for immunization: Secondary | ICD-10-CM | POA: Diagnosis not present

## 2016-08-21 ENCOUNTER — Ambulatory Visit (INDEPENDENT_AMBULATORY_CARE_PROVIDER_SITE_OTHER): Payer: Medicare Other | Admitting: Certified Nurse Midwife

## 2016-08-21 ENCOUNTER — Encounter: Payer: Self-pay | Admitting: Certified Nurse Midwife

## 2016-08-21 VITALS — BP 120/78 | HR 72 | Resp 16 | Ht 65.5 in | Wt 186.0 lb

## 2016-08-21 DIAGNOSIS — N816 Rectocele: Secondary | ICD-10-CM

## 2016-08-21 DIAGNOSIS — N951 Menopausal and female climacteric states: Secondary | ICD-10-CM | POA: Diagnosis not present

## 2016-08-21 DIAGNOSIS — Z01419 Encounter for gynecological examination (general) (routine) without abnormal findings: Secondary | ICD-10-CM

## 2016-08-21 NOTE — Progress Notes (Signed)
66 y.o. G59P2002 Divorced  Caucasian Fe here for annual exam. Menopausal no vaginal bleeding or vaginal dryness. Not sexually active. Small dry area on outside of vulva that itches at times. Occasional dryness. Sees Dr Dagmar Hait for Hypothyroid/Migraine management., aex, labs. All stable at this point. Had bilateral cataract surgery with good results. No other health issues today.  Patient's last menstrual period was 06/12/1999.          Sexually active: No.  The current method of family planning is tubal ligation.    Exercising: No.  exercise Smoker:  no  Health Maintenance: Pap:  08-11-13 neg, 08-18-15 neg MMG:  5/17 bilateral & left breast u/s 5/17 with 29mth f/u (stable 24mm cyst) f/u 62mths recommended Colonoscopy:  2007, due 38yrs, not scheduled yet BMD:   2016 TDaP:  2007 Shingles: 2017 Pneumonia: 2017 Hep C and HIV: not done Labs: pcp Self breast exam: done sometimes   reports that she has never smoked. She has never used smokeless tobacco. She reports that she does not drink alcohol or use drugs.  Past Medical History:  Diagnosis Date  . Cancer ([place])    skin, basal cell (forehead)  . Fibroid    small  . Migraines    with aura  . Thyroid disease    hypothyroid    Past Surgical History:  Procedure Laterality Date  . CATARACT EXTRACTION    . thyroid  nodule aspirated  4/12   cyst benign  . TUBAL LIGATION    . UMBILICAL HERNIA REPAIR     with BTL    Current Outpatient Prescriptions  Medication Sig Dispense Refill  . atorvastatin (LIPITOR) 10 MG tablet Take 10 mg by mouth daily.    Marland Kitchen levothyroxine (SYNTHROID, LEVOTHROID) 88 MCG tablet Take 88 mcg by mouth daily before breakfast.     No current facility-administered medications for this visit.     Family History  Problem Relation Age of Onset  . Other Mother     brain tumor    ROS:  Pertinent items are noted in HPI.  Otherwise, a comprehensive ROS was negative.  Exam:   BP 120/78   Pulse 72   Resp 16   Ht 5' 5.5"  (1.664 m)   Wt 186 lb (84.4 kg)   LMP 06/12/1999   BMI 30.48 kg/m  Height: 5' 5.5" (166.4 cm) Ht Readings from Last 3 Encounters:  08/21/16 5' 5.5" (1.664 m)  08/18/15 5' 5.75" (1.67 m)  08/16/14 5' 5.75" (1.67 m)    General appearance: alert, cooperative and appears stated age Head: Normocephalic, without obvious abnormality, atraumatic Neck: no adenopathy, supple, symmetrical, trachea midline and thyroid normal to inspection and palpation Lungs: clear to auscultation bilaterally Breasts: normal appearance, no masses or tenderness, No nipple retraction or dimpling, No nipple discharge or bleeding, No axillary or supraclavicular adenopathy Heart: regular rate and rhythm Abdomen: soft, non-tender; no masses,  no organomegaly Extremities: extremities normal, atraumatic, no cyanosis or edema Skin: Skin color, texture, turgor normal. No rashes or lesions Lymph nodes: Cervical, supraclavicular, and axillary nodes normal. No abnormal inguinal nodes palpated Neurologic: Grossly normal   Pelvic: External genitalia:  no lesions, normal female with dryness noted,               Urethra:  normal appearing urethra with no masses, tenderness or lesions              Bartholin's and Skene's: normal  Vagina: normal appearing vagina with normal color and discharge, no lesions, rectocele noted grade 1-2              Cervix: no cervical motion tenderness, no lesions and normal appearance              Pap taken: No. Bimanual Exam:  Uterus:  normal size, contour, position, consistency, mobility, non-tender              Adnexa: normal adnexa and no mass, fullness, tenderness               Rectovaginal: Confirms               Anus:  normal sphincter tone, no lesions  Chaperone present: yes  A:  Well Woman with normal exam  Menopausal no HRT  Vaginal and vulva dryness  Rectocele grade 1-2  Hypothyroid and Migraine headaches with PCP management  Bilateral Cataract surgery in the last  year   Immunization update  BMD and colonoscopy due  P:   Reviewed health and wellness pertinent to exam  Aware of need to advise if vaginal bleeding  Discussed dryness finding and coconut option to try with instructions. Patient does not want to use estrogen. Will call if no change  Discussed rectocele finding and etiology, patient having no constipation or stool issues. Encouraged to work on [employer] exercise. Advise if change in status.  Continue follow up with PCP as indicated  Will update TDAP at PCP  Patient will schedule BMD with mammogram and colonoscopy with PCP  Pap smear as above not taken   counseled on breast self exam, mammography screening, adequate intake of calcium and vitamin D, diet and exercise, Kegel's exercises  return annually or prn  An After Visit Summary was printed and given to the patient.

## 2016-08-23 DIAGNOSIS — H43811 Vitreous degeneration, right eye: Secondary | ICD-10-CM | POA: Diagnosis not present

## 2016-08-23 DIAGNOSIS — H26491 Other secondary cataract, right eye: Secondary | ICD-10-CM | POA: Diagnosis not present

## 2016-08-27 NOTE — Progress Notes (Signed)
Encounter reviewed Candy Leverett, MD   

## 2016-10-01 DIAGNOSIS — E041 Nontoxic single thyroid nodule: Secondary | ICD-10-CM | POA: Diagnosis not present

## 2016-10-01 DIAGNOSIS — E784 Other hyperlipidemia: Secondary | ICD-10-CM | POA: Diagnosis not present

## 2016-10-08 ENCOUNTER — Other Ambulatory Visit: Payer: Self-pay | Admitting: Internal Medicine

## 2016-10-08 DIAGNOSIS — E042 Nontoxic multinodular goiter: Secondary | ICD-10-CM

## 2016-10-08 DIAGNOSIS — K5900 Constipation, unspecified: Secondary | ICD-10-CM | POA: Diagnosis not present

## 2016-10-08 DIAGNOSIS — J302 Other seasonal allergic rhinitis: Secondary | ICD-10-CM | POA: Diagnosis not present

## 2016-10-08 DIAGNOSIS — E041 Nontoxic single thyroid nodule: Secondary | ICD-10-CM | POA: Diagnosis not present

## 2016-10-08 DIAGNOSIS — E784 Other hyperlipidemia: Secondary | ICD-10-CM | POA: Diagnosis not present

## 2016-10-08 DIAGNOSIS — R0989 Other specified symptoms and signs involving the circulatory and respiratory systems: Secondary | ICD-10-CM | POA: Diagnosis not present

## 2016-10-08 DIAGNOSIS — Z1389 Encounter for screening for other disorder: Secondary | ICD-10-CM | POA: Diagnosis not present

## 2016-10-08 DIAGNOSIS — E038 Other specified hypothyroidism: Secondary | ICD-10-CM | POA: Diagnosis not present

## 2016-10-08 DIAGNOSIS — Z6830 Body mass index (BMI) 30.0-30.9, adult: Secondary | ICD-10-CM | POA: Diagnosis not present

## 2016-10-08 DIAGNOSIS — E669 Obesity, unspecified: Secondary | ICD-10-CM | POA: Diagnosis not present

## 2016-10-08 DIAGNOSIS — Z0000 Encounter for general adult medical examination without abnormal findings: Secondary | ICD-10-CM | POA: Diagnosis not present

## 2016-10-15 DIAGNOSIS — Z1212 Encounter for screening for malignant neoplasm of rectum: Secondary | ICD-10-CM | POA: Diagnosis not present

## 2016-10-16 ENCOUNTER — Ambulatory Visit
Admission: RE | Admit: 2016-10-16 | Discharge: 2016-10-16 | Disposition: A | Discharge status: Home / Self Care | Payer: Medicare Other | Source: Ambulatory Visit | Attending: Internal Medicine | Admitting: Internal Medicine

## 2016-10-16 DIAGNOSIS — E041 Nontoxic single thyroid nodule: Secondary | ICD-10-CM | POA: Diagnosis not present

## 2016-10-16 DIAGNOSIS — E042 Nontoxic multinodular goiter: Secondary | ICD-10-CM

## 2016-10-18 DIAGNOSIS — Z1212 Encounter for screening for malignant neoplasm of rectum: Secondary | ICD-10-CM | POA: Diagnosis not present

## 2016-10-18 DIAGNOSIS — Z1211 Encounter for screening for malignant neoplasm of colon: Secondary | ICD-10-CM | POA: Diagnosis not present

## 2016-10-29 DIAGNOSIS — Z1231 Encounter for screening mammogram for malignant neoplasm of breast: Secondary | ICD-10-CM | POA: Diagnosis not present

## 2016-11-16 DIAGNOSIS — D225 Melanocytic nevi of trunk: Secondary | ICD-10-CM | POA: Diagnosis not present

## 2016-11-16 DIAGNOSIS — L814 Other melanin hyperpigmentation: Secondary | ICD-10-CM | POA: Diagnosis not present

## 2016-11-16 DIAGNOSIS — D485 Neoplasm of uncertain behavior of skin: Secondary | ICD-10-CM | POA: Diagnosis not present

## 2016-11-16 DIAGNOSIS — D2271 Melanocytic nevi of right lower limb, including hip: Secondary | ICD-10-CM | POA: Diagnosis not present

## 2016-11-16 DIAGNOSIS — Z85828 Personal history of other malignant neoplasm of skin: Secondary | ICD-10-CM | POA: Diagnosis not present

## 2016-11-16 DIAGNOSIS — C44719 Basal cell carcinoma of skin of left lower limb, including hip: Secondary | ICD-10-CM | POA: Diagnosis not present

## 2016-11-16 DIAGNOSIS — C44519 Basal cell carcinoma of skin of other part of trunk: Secondary | ICD-10-CM | POA: Diagnosis not present

## 2016-11-16 DIAGNOSIS — L821 Other seborrheic keratosis: Secondary | ICD-10-CM | POA: Diagnosis not present

## 2016-11-16 DIAGNOSIS — D1801 Hemangioma of skin and subcutaneous tissue: Secondary | ICD-10-CM | POA: Diagnosis not present

## 2016-11-16 DIAGNOSIS — L820 Inflamed seborrheic keratosis: Secondary | ICD-10-CM | POA: Diagnosis not present

## 2016-11-19 DIAGNOSIS — Z6829 Body mass index (BMI) 29.0-29.9, adult: Secondary | ICD-10-CM | POA: Diagnosis not present

## 2016-11-19 DIAGNOSIS — R030 Elevated blood-pressure reading, without diagnosis of hypertension: Secondary | ICD-10-CM | POA: Diagnosis not present

## 2016-11-26 ENCOUNTER — Telehealth: Payer: Self-pay | Admitting: *Deleted

## 2016-11-26 NOTE — Telephone Encounter (Signed)
Patient in 04 recall for 10/2016 for F/U left breast U/S. Please contact patient to schedule  Thanks

## 2016-11-27 NOTE — Telephone Encounter (Signed)
Patient states she went in for mammogram a couple of weeks ago. Per [facility] patient came in for screening mammogram but not the 58mth follow up. They are to fax results to our office. Results will be put in Donna Hines office for review.

## 2016-12-05 NOTE — Telephone Encounter (Signed)
Reviewed mammogram and acknowledged on report no interval change since [place], but no US done

## 2016-12-19 DIAGNOSIS — C44519 Basal cell carcinoma of skin of other part of trunk: Secondary | ICD-10-CM | POA: Diagnosis not present

## 2016-12-19 DIAGNOSIS — Z85828 Personal history of other malignant neoplasm of skin: Secondary | ICD-10-CM | POA: Diagnosis not present

## 2016-12-26 NOTE — Telephone Encounter (Signed)
Spoke with [employer]. As bilateral screening mammogram was completed and normal no u/s follow up is needed at this time. Okay to return to yearly screening.

## 2017-04-24 DIAGNOSIS — L308 Other specified dermatitis: Secondary | ICD-10-CM | POA: Diagnosis not present

## 2017-04-24 DIAGNOSIS — L910 Hypertrophic scar: Secondary | ICD-10-CM | POA: Diagnosis not present

## 2017-04-24 DIAGNOSIS — Z85828 Personal history of other malignant neoplasm of skin: Secondary | ICD-10-CM | POA: Diagnosis not present

## 2017-08-22 ENCOUNTER — Ambulatory Visit (INDEPENDENT_AMBULATORY_CARE_PROVIDER_SITE_OTHER): Payer: Medicare Other | Admitting: Certified Nurse Midwife

## 2017-08-22 ENCOUNTER — Other Ambulatory Visit: Payer: Self-pay

## 2017-08-22 ENCOUNTER — Encounter: Payer: Self-pay | Admitting: Certified Nurse Midwife

## 2017-08-22 VITALS — BP 102/64 | HR 70 | Resp 16 | Ht 65.25 in | Wt 156.0 lb

## 2017-08-22 DIAGNOSIS — Z01419 Encounter for gynecological examination (general) (routine) without abnormal findings: Secondary | ICD-10-CM | POA: Diagnosis not present

## 2017-08-22 DIAGNOSIS — Z780 Asymptomatic menopausal state: Secondary | ICD-10-CM | POA: Diagnosis not present

## 2017-08-22 DIAGNOSIS — L292 Pruritus vulvae: Secondary | ICD-10-CM | POA: Diagnosis not present

## 2017-08-22 NOTE — Progress Notes (Signed)
67 y.o. [id number] Divorced  Caucasian Fe here for annual exam. Menopausal not HRT. Denies vaginal bleeding or vaginal dryness. Sees Dr. Dagmar Hait for aex, labs and hypothyroid and cholesterol management. All stable. Has been participating in diabetic trial of medication that contributes to weight loss. Has lost 30 pounds and excited. Now off trial. Migraines with aura no more occurrences. No other health issues today.  Patient's last menstrual period was 06/12/1999.          Sexually active: No.  The current method of family planning is tubal ligation.    Exercising: Yes.    walking Smoker:  no  Health Maintenance: Pap:  08-18-15 neg History of Abnormal Pap: no MMG:  10-29-16 category c density birads 1:neg Self Breast exams: occ Colonoscopy:  2007, did cologard negative BMD:   2016 TDaP:  2018 Shingles: 2017 Pneumonia: 2017 Hep C and HIV: not done Labs: no   reports that  has never smoked. she has never used smokeless tobacco. She reports that she does not drink alcohol or use drugs.  Past Medical History:  Diagnosis Date  . Cancer ([place])    skin, basal cell (forehead)  . Fibroid    small  . Migraines    with aura  . Thyroid disease    hypothyroid    Past Surgical History:  Procedure Laterality Date  . CATARACT EXTRACTION    . thyroid  nodule aspirated  4/12   cyst benign  . TUBAL LIGATION    . UMBILICAL HERNIA REPAIR     with BTL    Current Outpatient Medications  Medication Sig Dispense Refill  . atorvastatin (LIPITOR) 10 MG tablet Take 10 mg by mouth daily.    Marland Kitchen levothyroxine (SYNTHROID, LEVOTHROID) 88 MCG tablet Take 88 mcg by mouth daily before breakfast.    . SEMAGLUTIDE [place] Inject into the skin once a week.     No current facility-administered medications for this visit.     Family History  Problem Relation Age of Onset  . Other Mother        brain tumor    ROS:  Pertinent items are noted in HPI.  Otherwise, a comprehensive ROS was negative.  Exam:   BP  102/64   Pulse 70   Resp 16   Ht 5' 5.25" (1.657 m)   Wt 156 lb (70.8 kg)   LMP 06/12/1999   BMI 25.76 kg/m  Height: 5' 5.25" (165.7 cm) Ht Readings from Last 3 Encounters:  08/22/17 5' 5.25" (1.657 m)  08/21/16 5' 5.5" (1.664 m)  08/18/15 5' 5.75" (1.67 m)    General appearance: alert, cooperative and appears stated age Head: Normocephalic, without obvious abnormality, atraumatic Neck: no adenopathy, supple, symmetrical, trachea midline and thyroid normal to inspection and palpation Lungs: clear to auscultation bilaterally Breasts: normal appearance, no masses or tenderness, No nipple retraction or dimpling, No nipple discharge or bleeding, No axillary or supraclavicular adenopathy Heart: regular rate and rhythm Abdomen: soft, non-tender; no masses,  no organomegaly Extremities: extremities normal, atraumatic, no cyanosis or edema Skin: Skin color, texture, turgor normal. No rashes or lesions Lymph nodes: Cervical, supraclavicular, and axillary nodes normal. No abnormal inguinal nodes palpated Neurologic: Grossly normal   Pelvic: External genitalia:  no lesions, normal female, small ? LS area outside clitoral hood area noted, ? yeast              Urethra:  normal appearing urethra with no masses, tenderness or lesions  Bartholin's and Skene's: normal                 Vagina: normal appearing vagina with normal color and discharge, no lesions, white thick discharge noted, scant amount              Cervix: no cervical motion tenderness, no lesions and normal appearance              Pap taken: No. Bimanual Exam:  Uterus:  normal size, contour, position, consistency, mobility, non-tender and anteverted              Adnexa: normal adnexa and no mass, fullness, tenderness               Rectovaginal: Confirms               Anus:  normal sphincter tone, no lesions  Chaperone present: yes  A:  Well Woman with normal exam  Menopausal no HRT  Vulva itching  History of  Migraines aura no change  MD management of Hypothyroid and cholesterol  Colonoscopy due    P:   Reviewed health and wellness pertinent to exam  Aware of need to advise if vaginal bleeding  Discussed comfort measures of Aveeno sitz bath and Aveeno Eczema cream prn until lab back. R/O yeast Discussed ? LS appearance and will discuss once affirm in.  Lab: affirm  Discussed importance of seeing PCP if any change, think stroke risk.  Continue follow with MD as indicated  Will advise when ready for referral to Dr. Collene Mares  Pap smear: no   counseled on breast self exam, mammography screening, feminine hygiene, adequate intake of calcium and vitamin D, diet and exercise  return annually or prn  An After Visit Summary was printed and given to the patient.

## 2017-08-22 NOTE — Patient Instructions (Signed)

## 2017-08-23 ENCOUNTER — Other Ambulatory Visit: Payer: Self-pay | Admitting: *Deleted

## 2017-08-23 DIAGNOSIS — L900 Lichen sclerosus et atrophicus: Secondary | ICD-10-CM

## 2017-08-23 LAB — VAGINITIS/VAGINOSIS, DNA PROBE
CANDIDA SPECIES: NEGATIVE
GARDNERELLA VAGINALIS: NEGATIVE
Trichomonas vaginosis: NEGATIVE

## 2017-08-23 MED ORDER — CLOBETASOL PROPIONATE 0.05 % EX OINT: TOPICAL_OINTMENT | CUTANEOUS | 0 refills | Status: DC

## 2017-08-26 DIAGNOSIS — H26493 Other secondary cataract, bilateral: Secondary | ICD-10-CM | POA: Diagnosis not present

## 2017-09-16 ENCOUNTER — Telehealth: Payer: Self-pay | Admitting: Certified Nurse Midwife

## 2017-09-16 NOTE — Telephone Encounter (Signed)
Patient canceled her recheck appointment 09/19/17. Patient said "I stopped taking the medication and no longer need this appointment".

## 2017-09-19 ENCOUNTER — Ambulatory Visit: Payer: Self-pay | Admitting: Certified Nurse Midwife

## 2017-09-19 NOTE — Telephone Encounter (Signed)
Ok to close

## 2017-09-19 NOTE — Telephone Encounter (Signed)
Okay to close encounter?  °

## 2017-10-28 DIAGNOSIS — R82998 Other abnormal findings in urine: Secondary | ICD-10-CM | POA: Diagnosis not present

## 2017-10-28 DIAGNOSIS — E038 Other specified hypothyroidism: Secondary | ICD-10-CM | POA: Diagnosis not present

## 2017-10-28 DIAGNOSIS — E7849 Other hyperlipidemia: Secondary | ICD-10-CM | POA: Diagnosis not present

## 2017-11-11 DIAGNOSIS — K5909 Other constipation: Secondary | ICD-10-CM | POA: Diagnosis not present

## 2017-11-11 DIAGNOSIS — R0989 Other specified symptoms and signs involving the circulatory and respiratory systems: Secondary | ICD-10-CM | POA: Diagnosis not present

## 2017-11-11 DIAGNOSIS — E7849 Other hyperlipidemia: Secondary | ICD-10-CM | POA: Diagnosis not present

## 2017-11-11 DIAGNOSIS — E038 Other specified hypothyroidism: Secondary | ICD-10-CM | POA: Diagnosis not present

## 2017-11-11 DIAGNOSIS — Z1231 Encounter for screening mammogram for malignant neoplasm of breast: Secondary | ICD-10-CM | POA: Diagnosis not present

## 2017-11-11 DIAGNOSIS — Z6823 Body mass index (BMI) 23.0-23.9, adult: Secondary | ICD-10-CM | POA: Diagnosis not present

## 2017-11-11 DIAGNOSIS — E668 Other obesity: Secondary | ICD-10-CM | POA: Diagnosis not present

## 2017-11-11 DIAGNOSIS — Z0000 Encounter for general adult medical examination without abnormal findings: Secondary | ICD-10-CM | POA: Diagnosis not present

## 2017-11-11 DIAGNOSIS — J302 Other seasonal allergic rhinitis: Secondary | ICD-10-CM | POA: Diagnosis not present

## 2017-11-11 DIAGNOSIS — Z1389 Encounter for screening for other disorder: Secondary | ICD-10-CM | POA: Diagnosis not present

## 2017-11-11 DIAGNOSIS — E041 Nontoxic single thyroid nodule: Secondary | ICD-10-CM | POA: Diagnosis not present

## 2017-11-11 DIAGNOSIS — R030 Elevated blood-pressure reading, without diagnosis of hypertension: Secondary | ICD-10-CM | POA: Diagnosis not present

## 2017-11-19 ENCOUNTER — Encounter: Payer: Self-pay | Admitting: Obstetrics & Gynecology

## 2018-01-02 DIAGNOSIS — C4441 Basal cell carcinoma of skin of scalp and neck: Secondary | ICD-10-CM | POA: Diagnosis not present

## 2018-01-02 DIAGNOSIS — C44319 Basal cell carcinoma of skin of other parts of face: Secondary | ICD-10-CM | POA: Diagnosis not present

## 2018-01-02 DIAGNOSIS — L723 Sebaceous cyst: Secondary | ICD-10-CM | POA: Diagnosis not present

## 2018-01-02 DIAGNOSIS — D2262 Melanocytic nevi of left upper limb, including shoulder: Secondary | ICD-10-CM | POA: Diagnosis not present

## 2018-01-02 DIAGNOSIS — D2261 Melanocytic nevi of right upper limb, including shoulder: Secondary | ICD-10-CM | POA: Diagnosis not present

## 2018-01-02 DIAGNOSIS — L814 Other melanin hyperpigmentation: Secondary | ICD-10-CM | POA: Diagnosis not present

## 2018-01-02 DIAGNOSIS — C44519 Basal cell carcinoma of skin of other part of trunk: Secondary | ICD-10-CM | POA: Diagnosis not present

## 2018-01-02 DIAGNOSIS — L821 Other seborrheic keratosis: Secondary | ICD-10-CM | POA: Diagnosis not present

## 2018-01-02 DIAGNOSIS — D171 Benign lipomatous neoplasm of skin and subcutaneous tissue of trunk: Secondary | ICD-10-CM | POA: Diagnosis not present

## 2018-01-02 DIAGNOSIS — D485 Neoplasm of uncertain behavior of skin: Secondary | ICD-10-CM | POA: Diagnosis not present

## 2018-01-02 DIAGNOSIS — L570 Actinic keratosis: Secondary | ICD-10-CM | POA: Diagnosis not present

## 2018-01-02 DIAGNOSIS — Z85828 Personal history of other malignant neoplasm of skin: Secondary | ICD-10-CM | POA: Diagnosis not present

## 2018-02-05 DIAGNOSIS — Z85828 Personal history of other malignant neoplasm of skin: Secondary | ICD-10-CM | POA: Diagnosis not present

## 2018-02-05 DIAGNOSIS — C44319 Basal cell carcinoma of skin of other parts of face: Secondary | ICD-10-CM | POA: Diagnosis not present

## 2018-02-26 DIAGNOSIS — C44519 Basal cell carcinoma of skin of other part of trunk: Secondary | ICD-10-CM | POA: Diagnosis not present

## 2018-02-26 DIAGNOSIS — Z85828 Personal history of other malignant neoplasm of skin: Secondary | ICD-10-CM | POA: Diagnosis not present

## 2018-06-18 DIAGNOSIS — J069 Acute upper respiratory infection, unspecified: Secondary | ICD-10-CM | POA: Diagnosis not present

## 2018-06-18 DIAGNOSIS — R8290 Unspecified abnormal findings in urine: Secondary | ICD-10-CM | POA: Diagnosis not present

## 2018-06-18 DIAGNOSIS — Z6822 Body mass index (BMI) 22.0-22.9, adult: Secondary | ICD-10-CM | POA: Diagnosis not present

## 2018-06-18 DIAGNOSIS — N390 Urinary tract infection, site not specified: Secondary | ICD-10-CM | POA: Diagnosis not present

## 2018-06-18 DIAGNOSIS — E038 Other specified hypothyroidism: Secondary | ICD-10-CM | POA: Diagnosis not present

## 2018-06-18 DIAGNOSIS — R0789 Other chest pain: Secondary | ICD-10-CM | POA: Diagnosis not present

## 2018-08-26 ENCOUNTER — Other Ambulatory Visit: Payer: Self-pay

## 2018-08-26 ENCOUNTER — Encounter: Payer: Self-pay | Admitting: Certified Nurse Midwife

## 2018-08-26 ENCOUNTER — Other Ambulatory Visit (HOSPITAL_COMMUNITY)
Admission: RE | Admit: 2018-08-26 | Discharge: 2018-08-26 | Disposition: A | Discharge status: Home / Self Care | Payer: Medicare Other | Source: Ambulatory Visit | Attending: Certified Nurse Midwife | Admitting: Certified Nurse Midwife

## 2018-08-26 ENCOUNTER — Ambulatory Visit (INDEPENDENT_AMBULATORY_CARE_PROVIDER_SITE_OTHER): Payer: Medicare Other | Admitting: Certified Nurse Midwife

## 2018-08-26 VITALS — BP 110/62 | HR 68 | Resp 16 | Ht 65.25 in | Wt 148.0 lb

## 2018-08-26 DIAGNOSIS — Z8639 Personal history of other endocrine, nutritional and metabolic disease: Secondary | ICD-10-CM

## 2018-08-26 DIAGNOSIS — Z1151 Encounter for screening for human papillomavirus (HPV): Secondary | ICD-10-CM | POA: Insufficient documentation

## 2018-08-26 DIAGNOSIS — N952 Postmenopausal atrophic vaginitis: Secondary | ICD-10-CM | POA: Diagnosis not present

## 2018-08-26 DIAGNOSIS — Z01419 Encounter for gynecological examination (general) (routine) without abnormal findings: Secondary | ICD-10-CM | POA: Diagnosis not present

## 2018-08-26 DIAGNOSIS — Z124 Encounter for screening for malignant neoplasm of cervix: Secondary | ICD-10-CM | POA: Diagnosis not present

## 2018-08-26 DIAGNOSIS — Z780 Asymptomatic menopausal state: Secondary | ICD-10-CM

## 2018-08-26 NOTE — Progress Notes (Signed)
68 y.o. [id number] Divorced  Caucasian Fe here for annual exam. Menopausal no HRT. Denies vaginal bleeding or dryness. No HSV outbreaks and Rx current.  Sees Dr. Dagmar Hait for aex, labs and cholesterol and hypothyroid management. Now out of drug trial for weight loss with 40 pound weight loss. Now working on diet and walking for exercise. Had planned a trip to [place], but will postpone until later for Birthday celebration! No other health issues today.  Patient's last menstrual period was 06/12/1999.          Sexually active: No.  The current method of family planning is tubal ligation. Exercising: Yes.    walking Smoker:  no  Review of Systems  Constitutional: Negative.   HENT: Negative.   Eyes: Negative.   Respiratory: Negative.   Cardiovascular: Negative.   Gastrointestinal: Negative.   Genitourinary: Negative.   Musculoskeletal: Negative.   Skin: Negative.   Neurological: Negative.   Endo/Heme/Allergies: Negative.   Psychiatric/Behavioral: Negative.     Health Maintenance: Pap:  08-18-15 neg History of Abnormal Pap: no MMG:  11-11-2017 category c density birads 1:neg Self Breast exams: occ Colonoscopy:  2007, did stool kit BMD:   2016 normal repeat 3-5 years PCP manages TDaP:  2018 Shingles: 2019 Pneumonia: 2017 Hep C and HIV: not done Labs: if needed   reports that she has never smoked. She has never used smokeless tobacco. She reports current alcohol use. She reports that she does not use drugs.  Past Medical History:  Diagnosis Date  . Cancer ([place])    skin, basal cell (forehead)  . Fibroid    small  . Migraines    with aura  . Thyroid disease    hypothyroid    Past Surgical History:  Procedure Laterality Date  . CATARACT EXTRACTION    . thyroid  nodule aspirated  4/12   cyst benign  . TUBAL LIGATION    . UMBILICAL HERNIA REPAIR     with BTL    Current Outpatient Medications  Medication Sig Dispense Refill  . atorvastatin (LIPITOR) 10 MG tablet Take 10 mg by  mouth daily.    Marland Kitchen levothyroxine (SYNTHROID, LEVOTHROID) 88 MCG tablet Take 88 mcg by mouth daily before breakfast.    . valACYclovir (VALTREX) 500 MG tablet as needed.     No current facility-administered medications for this visit.     Family History  Problem Relation Age of Onset  . Other Mother        brain tumor    ROS:  Pertinent items are noted in HPI.  Otherwise, a comprehensive ROS was negative.  Exam:   BP 110/62   Pulse 68   Resp 16   Ht 5' 5.25" (1.657 m)   Wt 148 lb (67.1 kg)   LMP 06/12/1999   BMI 24.44 kg/m  Height: 5' 5.25" (165.7 cm) Ht Readings from Last 3 Encounters:  08/26/18 5' 5.25" (1.657 m)  08/22/17 5' 5.25" (1.657 m)  08/21/16 5' 5.5" (1.664 m)    General appearance: alert, cooperative and appears stated age Head: Normocephalic, without obvious abnormality, atraumatic Neck: no adenopathy, supple, symmetrical, trachea midline and thyroid known enlargement to inspection and palpation Lungs: clear to auscultation bilaterally Breasts: normal appearance, no masses or tenderness, No nipple retraction or dimpling, No nipple discharge or bleeding, No axillary or supraclavicular adenopathy Heart: regular rate and rhythm Abdomen: soft, non-tender; no masses,  no organomegaly Extremities: extremities normal, atraumatic, no cyanosis or edema Skin: Skin color, texture, turgor normal. No  rashes or lesions Lymph nodes: Cervical, supraclavicular, and axillary nodes normal. No abnormal inguinal nodes palpated Neurologic: Grossly normal   Pelvic: External genitalia:  no lesions, normal female              Urethra:  normal appearing urethra with no masses, tenderness or lesions              Bartholin's and Skene's: normal                 Vagina: normal appearing vagina with normal color and discharge, no lesions              Cervix: no cervical motion tenderness, no lesions and normal appearance and no bleeding              Pap taken: Yes.   Bimanual Exam:   Uterus:  normal size, contour, position, consistency, mobility, non-tender and anteverted              Adnexa: normal adnexa and no mass, fullness, tenderness               Rectovaginal: Confirms               Anus:  normal sphincter tone, no lesions  Chaperone present: yes  A:  Well Woman with normal exam  Post menopausal no HRT.  Hypothyroid/cholesterol with PCP management  Continues weight loss management with diet  P:   Reviewed health and wellness pertinent to exam  Aware to advise if vaginal bleeding.  Continue with follow up with PCP as indicated  Encouraged to eat well and exercise as she learned from program  Pap smear: yes   counseled on breast self exam, mammography screening, adequate intake of calcium and vitamin D, diet and exercise, Kegel's exercises  return annually or prn  An After Visit Summary was printed and given to the patient.

## 2018-08-26 NOTE — Patient Instructions (Signed)

## 2018-08-28 LAB — CYTOLOGY - PAP
DIAGNOSIS: NEGATIVE
HPV (WINDOPATH): NOT DETECTED

## 2018-08-29 ENCOUNTER — Ambulatory Visit: Payer: Self-pay | Admitting: Certified Nurse Midwife

## 2018-11-24 DIAGNOSIS — R82998 Other abnormal findings in urine: Secondary | ICD-10-CM | POA: Diagnosis not present

## 2018-11-24 DIAGNOSIS — E039 Hypothyroidism, unspecified: Secondary | ICD-10-CM | POA: Diagnosis not present

## 2018-11-24 DIAGNOSIS — E7849 Other hyperlipidemia: Secondary | ICD-10-CM | POA: Diagnosis not present

## 2018-11-27 ENCOUNTER — Encounter: Payer: Self-pay | Admitting: Certified Nurse Midwife

## 2018-11-27 DIAGNOSIS — Z1231 Encounter for screening mammogram for malignant neoplasm of breast: Secondary | ICD-10-CM | POA: Diagnosis not present

## 2018-12-01 DIAGNOSIS — E039 Hypothyroidism, unspecified: Secondary | ICD-10-CM | POA: Diagnosis not present

## 2018-12-01 DIAGNOSIS — J302 Other seasonal allergic rhinitis: Secondary | ICD-10-CM | POA: Diagnosis not present

## 2018-12-01 DIAGNOSIS — K5900 Constipation, unspecified: Secondary | ICD-10-CM | POA: Diagnosis not present

## 2018-12-01 DIAGNOSIS — R030 Elevated blood-pressure reading, without diagnosis of hypertension: Secondary | ICD-10-CM | POA: Diagnosis not present

## 2018-12-01 DIAGNOSIS — Z0000 Encounter for general adult medical examination without abnormal findings: Secondary | ICD-10-CM | POA: Diagnosis not present

## 2018-12-01 DIAGNOSIS — Z1339 Encounter for screening examination for other mental health and behavioral disorders: Secondary | ICD-10-CM | POA: Diagnosis not present

## 2018-12-01 DIAGNOSIS — Z1331 Encounter for screening for depression: Secondary | ICD-10-CM | POA: Diagnosis not present

## 2018-12-01 DIAGNOSIS — E669 Obesity, unspecified: Secondary | ICD-10-CM | POA: Diagnosis not present

## 2018-12-01 DIAGNOSIS — E785 Hyperlipidemia, unspecified: Secondary | ICD-10-CM | POA: Diagnosis not present

## 2019-01-02 IMAGING — US US THYROID
1 series · 13 of 25 positions shown · non-contrast
Comparison: 08/24/2013; 07/13/2010

CLINICAL DATA: Prior ultrasound follow-up.

EXAM:
THYROID ULTRASOUND
TECHNIQUE: Ultrasound examination of the thyroid gland and adjacent soft
tissues was performed.

[Series 1: us thyroid · 0.07mm/px · 13 of 40 slices shown]
[im 1/40]
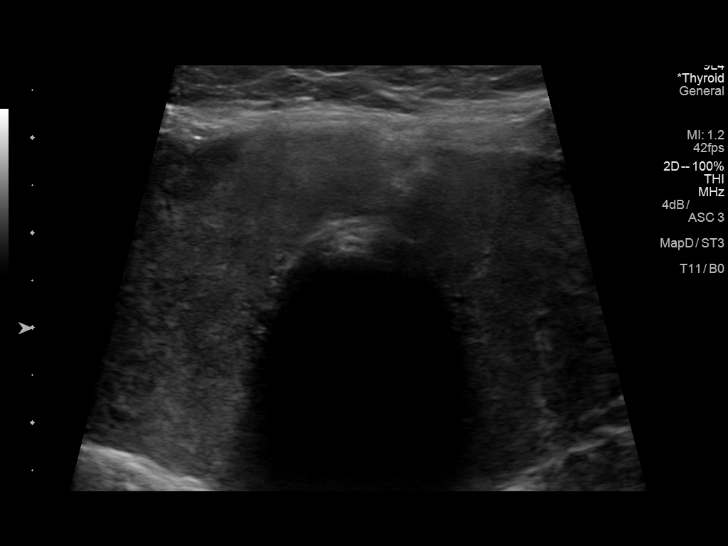
[im 4/40]
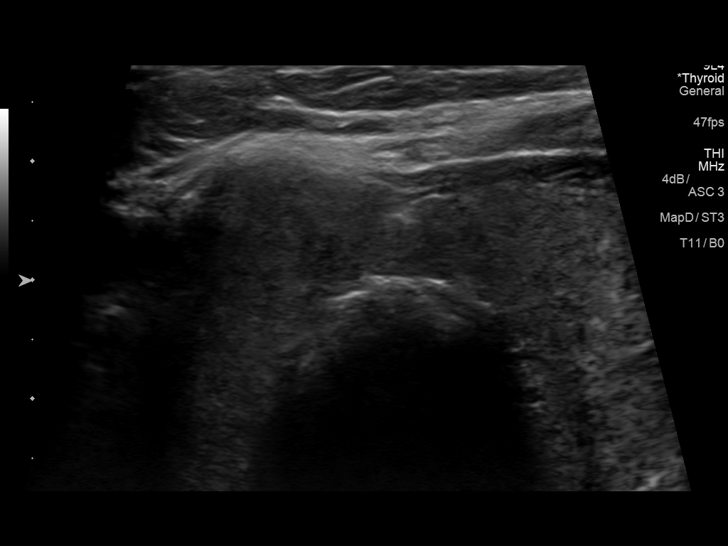
[im 7/40]
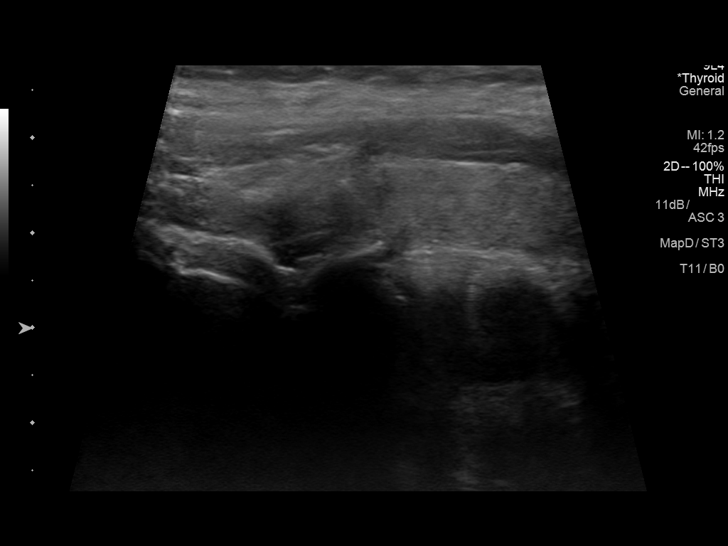
[im 10/40]
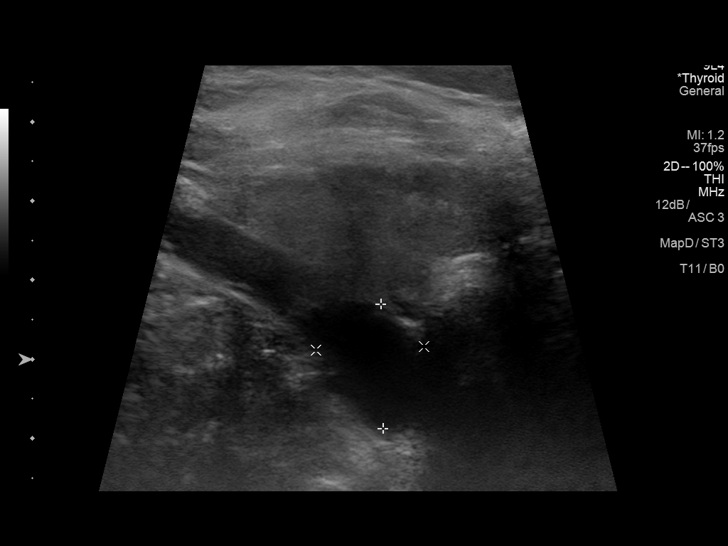
[im 14/40]
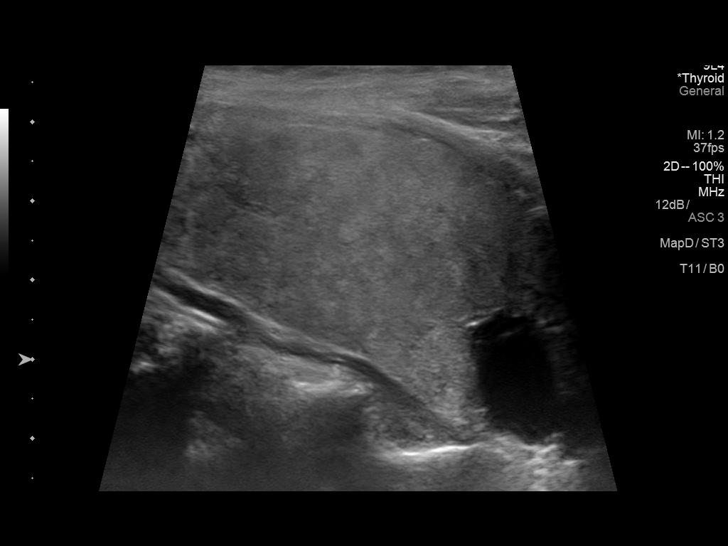
[im 17/40]
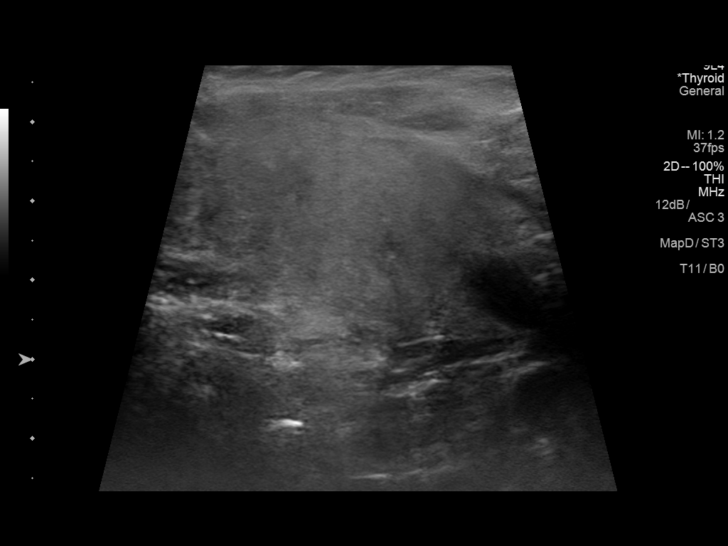
[im 20/40]
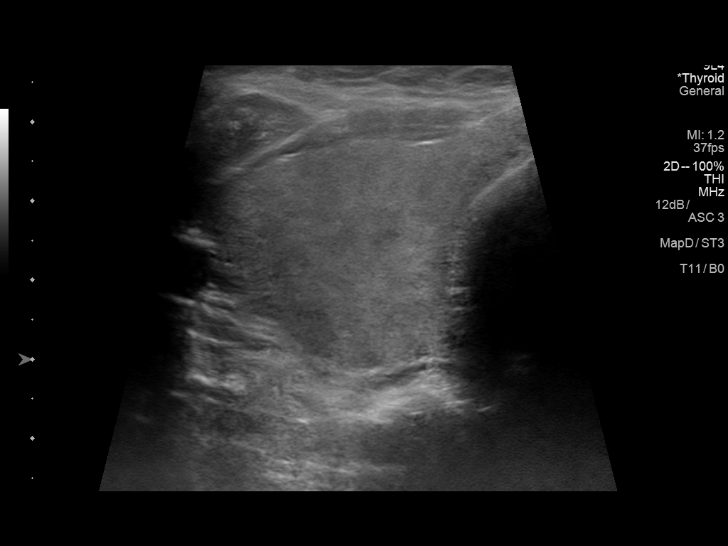
[im 23/40]
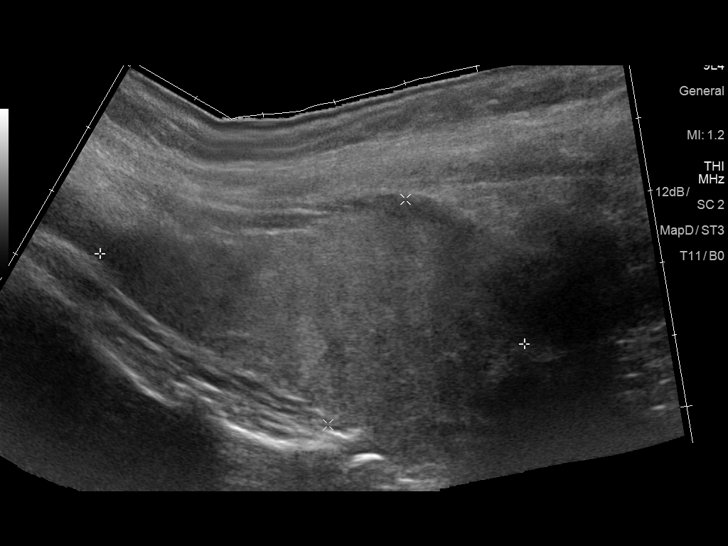
[im 27/40]
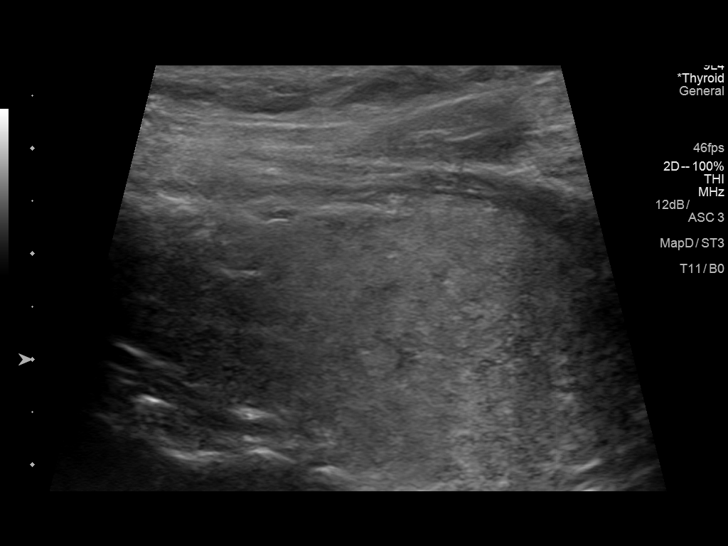
[im 30/40]
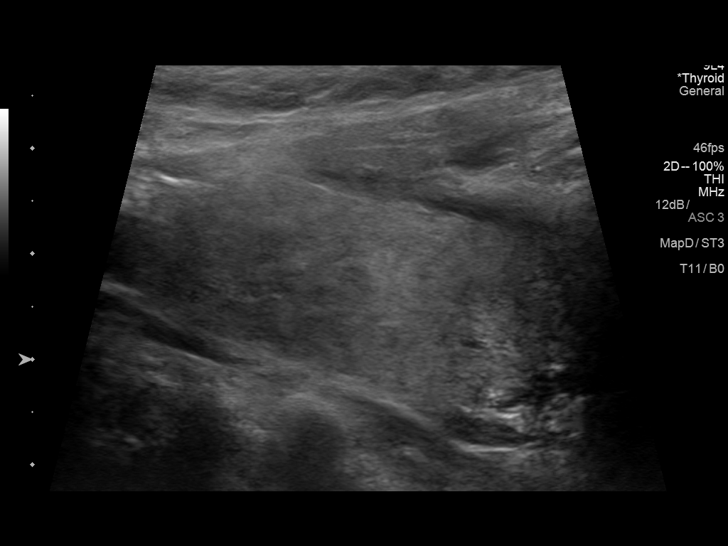
[im 33/40]
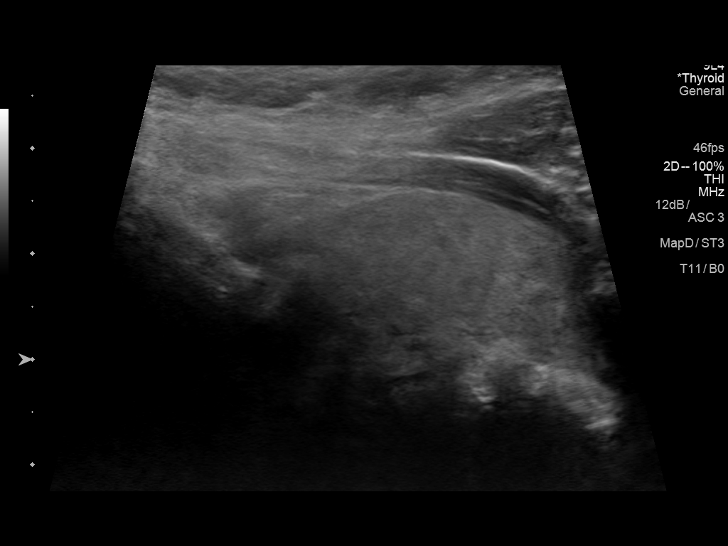
[im 36/40]
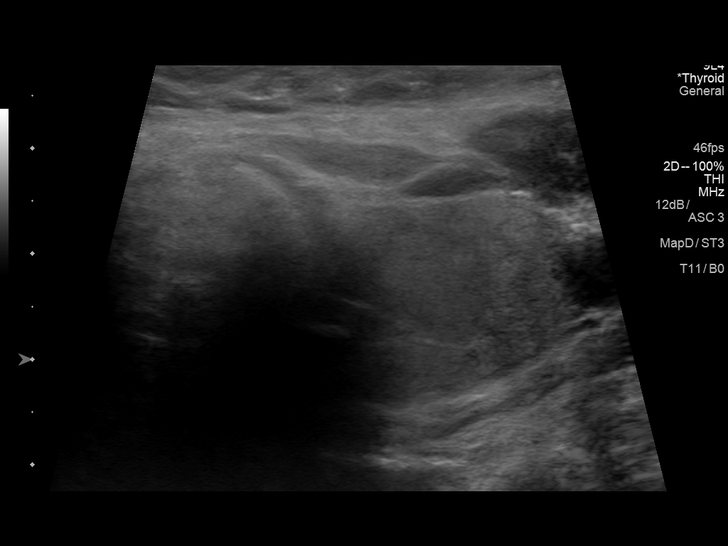
[im 40/40]
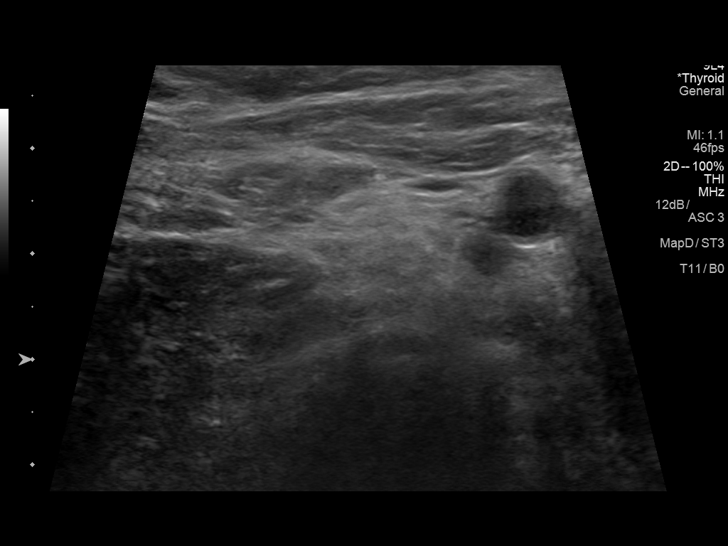

[13 of 25 positions shown; findings below may reference images not displayed]

FINDINGS: Parenchymal Echotexture: Moderately heterogenous

Isthmus: Enlarged measuring 1.1 cm in diameter, unchanged,
previously, 0.8 cm

Right lobe: Enlarged measuring 6.8 x 2.9 x 3.2 cm, unchanged,
previously, 6.3 x 2.7 x 3.1 cm

Left lobe: Normal in size measuring 5.9 x 3.2 x 2.3 cm, unchanged,
previously, 5.8 x 2.8 x 2.0 cm

_________________________________________________________

Estimated total number of nodules >/= 1 cm: 1

Number of spongiform nodules >/=  2 cm not described below (TR1): 0

Number of mixed cystic and solid nodules >/= 1.5 cm not described
below (TR2): 0

_________________________________________________________

Nodule # 1:

Prior biopsy: No

Location: Right; Inferior

Maximum size: 2.1 cm; Other 2 dimensions: 1.6 x 1.4 cm, previously,
2.7 x 1.5 x 1.2 cm

Composition: cystic/almost completely cystic (0)

Echogenicity: anechoic (0)

Shape: not taller-than-wide (0)

Margins: smooth (0)

Echogenic foci: none (0)

ACR TI-RADS total points: 0.

ACR TI-RADS risk category:  TR1 (0-1 points).

Significant change in size (>/= 20% in two dimensions and minimal
increase of 2 mm): No

Change in features: No

Change in ACR TI-RADS risk category: No

ACR TI-RADS recommendations:

This nodule does NOT meet TI-RADS criteria for biopsy or dedicated
follow-up. Additionally, this nodule has been stable for greater
than 5 years and thus does not meet imaging criteria for additional
dedicated follow-up.

_________________________________________________________
IMPRESSION: Solitary approximately 2.1 cm cyst within the inferior aspect of the
right lobe of the thyroid is unchanged to decreased in size compared
to the 0010 examination and does not meet imaging criteria to
recommend percutaneous sampling or dedicated follow-up.
benign etiology.

The above is in keeping with the ACR TI-RADS recommendations - [HOSPITAL] 2818;[DATE].

## 2019-01-29 DIAGNOSIS — C44619 Basal cell carcinoma of skin of left upper limb, including shoulder: Secondary | ICD-10-CM | POA: Diagnosis not present

## 2019-01-29 DIAGNOSIS — D225 Melanocytic nevi of trunk: Secondary | ICD-10-CM | POA: Diagnosis not present

## 2019-01-29 DIAGNOSIS — D485 Neoplasm of uncertain behavior of skin: Secondary | ICD-10-CM | POA: Diagnosis not present

## 2019-01-29 DIAGNOSIS — L821 Other seborrheic keratosis: Secondary | ICD-10-CM | POA: Diagnosis not present

## 2019-01-29 DIAGNOSIS — D2261 Melanocytic nevi of right upper limb, including shoulder: Secondary | ICD-10-CM | POA: Diagnosis not present

## 2019-01-29 DIAGNOSIS — D2271 Melanocytic nevi of right lower limb, including hip: Secondary | ICD-10-CM | POA: Diagnosis not present

## 2019-01-29 DIAGNOSIS — Z85828 Personal history of other malignant neoplasm of skin: Secondary | ICD-10-CM | POA: Diagnosis not present

## 2019-01-29 DIAGNOSIS — L814 Other melanin hyperpigmentation: Secondary | ICD-10-CM | POA: Diagnosis not present

## 2019-01-29 DIAGNOSIS — L570 Actinic keratosis: Secondary | ICD-10-CM | POA: Diagnosis not present

## 2019-04-15 DIAGNOSIS — Z20828 Contact with and (suspected) exposure to other viral communicable diseases: Secondary | ICD-10-CM | POA: Diagnosis not present

## 2019-06-23 DIAGNOSIS — Z8616 Personal history of COVID-19: Secondary | ICD-10-CM | POA: Diagnosis not present

## 2019-07-02 DIAGNOSIS — L820 Inflamed seborrheic keratosis: Secondary | ICD-10-CM | POA: Diagnosis not present

## 2019-07-02 DIAGNOSIS — L910 Hypertrophic scar: Secondary | ICD-10-CM | POA: Diagnosis not present

## 2019-07-02 DIAGNOSIS — Z85828 Personal history of other malignant neoplasm of skin: Secondary | ICD-10-CM | POA: Diagnosis not present

## 2019-08-14 DIAGNOSIS — Z961 Presence of intraocular lens: Secondary | ICD-10-CM | POA: Diagnosis not present

## 2019-08-14 DIAGNOSIS — H18413 Arcus senilis, bilateral: Secondary | ICD-10-CM | POA: Diagnosis not present

## 2019-08-14 DIAGNOSIS — H26493 Other secondary cataract, bilateral: Secondary | ICD-10-CM | POA: Diagnosis not present

## 2019-08-14 DIAGNOSIS — H43811 Vitreous degeneration, right eye: Secondary | ICD-10-CM | POA: Diagnosis not present

## 2019-08-27 ENCOUNTER — Ambulatory Visit: Payer: Medicare Other | Admitting: Certified Nurse Midwife

## 2019-08-27 DIAGNOSIS — L309 Dermatitis, unspecified: Secondary | ICD-10-CM | POA: Diagnosis not present

## 2019-08-27 DIAGNOSIS — Z85828 Personal history of other malignant neoplasm of skin: Secondary | ICD-10-CM | POA: Diagnosis not present

## 2019-08-28 ENCOUNTER — Other Ambulatory Visit: Payer: Self-pay

## 2019-08-28 ENCOUNTER — Ambulatory Visit: Payer: Medicare Other | Admitting: Certified Nurse Midwife

## 2019-08-31 ENCOUNTER — Encounter: Payer: Self-pay | Admitting: Certified Nurse Midwife

## 2019-08-31 ENCOUNTER — Other Ambulatory Visit: Payer: Self-pay

## 2019-08-31 ENCOUNTER — Ambulatory Visit (INDEPENDENT_AMBULATORY_CARE_PROVIDER_SITE_OTHER): Payer: Medicare Other | Admitting: Certified Nurse Midwife

## 2019-08-31 VITALS — BP 118/80 | HR 68 | Temp 97.3°F | Resp 16 | Ht 65.25 in | Wt 172.0 lb

## 2019-08-31 DIAGNOSIS — Z124 Encounter for screening for malignant neoplasm of cervix: Secondary | ICD-10-CM | POA: Diagnosis not present

## 2019-08-31 DIAGNOSIS — R21 Rash and other nonspecific skin eruption: Secondary | ICD-10-CM

## 2019-08-31 DIAGNOSIS — Z780 Asymptomatic menopausal state: Secondary | ICD-10-CM | POA: Diagnosis not present

## 2019-08-31 DIAGNOSIS — Z01419 Encounter for gynecological examination (general) (routine) without abnormal findings: Secondary | ICD-10-CM

## 2019-08-31 NOTE — Progress Notes (Signed)
69 y.o. [id number] Divorced  Caucasian Fe here for annual exam. Post menopausal no HRT. Denies vaginal bleeding or vaginal dryness. Sees Dr. Dagmar Hait for aex, labs, cholesterol,hypothyroid management. No HSV outbreaks, no medication update needed. Was seen by dermatology for fine red rash on legs. Was given Sarna lotion for itching which is helping. Possible lotion reaction.  No other health issues today.  Patient's last menstrual period was 06/12/1999.          Sexually active: No.  The current method of family planning is tubal ligation.    Exercising: Yes.    walking Smoker:  no  Review of Systems  Constitutional: Negative.   HENT: Negative.   Eyes: Negative.   Respiratory: Negative.   Cardiovascular: Negative.   Gastrointestinal: Negative.   Genitourinary: Negative.   Musculoskeletal: Negative.   Skin: Negative.   Neurological: Negative.   Endo/Heme/Allergies: Negative.   Psychiatric/Behavioral: Negative.     Health Maintenance: Pap:  08-18-15 neg, 08-26-2018 no endos due to atrophy, neg HPV HR neg History of Abnormal Pap: no MMG:  11-27-2018 category b density birads 1:neg Self Breast exams: no Colonoscopy:  2007, did stool kit done 26yr ago, pcp manages BMD:   2016 normal f/u 3-575yr pcp manages TDaP:  2018 Shingles: 2019 Pneumonia: 2021 Hep C and HIV: not done Labs: PCP   reports that she has never smoked. She has never used smokeless tobacco. She reports current alcohol use. She reports that she does not use drugs.  Past Medical History:  Diagnosis Date  . Cancer ([place]   skin, basal cell (forehead)  . Fibroid    small  . Migraines    with aura  . Thyroid disease    hypothyroid    Past Surgical History:  Procedure Laterality Date  . CATARACT EXTRACTION    . thyroid  nodule aspirated  4/12   cyst benign  . TUBAL LIGATION    . UMBILICAL HERNIA REPAIR     with BTL    Current Outpatient Medications  Medication Sig Dispense Refill  . atorvastatin (LIPITOR) 10 MG  tablet Take 10 mg by mouth daily.    . Marland Kitchenevothyroxine (SYNTHROID, LEVOTHROID) 88 MCG tablet Take 88 mcg by mouth daily before breakfast.    . valACYclovir (VALTREX) 500 MG tablet as needed.     No current facility-administered medications for this visit.    Family History  Problem Relation Age of Onset  . Other Mother        brain tumor    ROS:  Pertinent items are noted in HPI.  Otherwise, a comprehensive ROS was negative.  Exam:   LMP 06/12/1999    Ht Readings from Last 3 Encounters:  08/26/18 5' 5.25" (1.657 m)  08/22/17 5' 5.25" (1.657 m)  08/21/16 5' 5.5" (1.664 m)    General appearance: alert, cooperative and appears stated age Head: Normocephalic, without obvious abnormality, atraumatic Neck: no adenopathy, supple, symmetrical, trachea midline and thyroid mildy enlarged with known nodule noted slightly midline Lungs: clear to auscultation bilaterally Breasts: normal appearance, no masses or tenderness, No nipple retraction or dimpling, No nipple discharge or bleeding, No axillary or supraclavicular adenopathy, Normal to palpation without dominant masses Heart: regular rate and rhythm Abdomen: soft, non-tender; no masses,  no organomegaly Extremities: extremities normal, atraumatic, no cyanosis or edema Skin: Skin color, texture, turgor normal. No rashes or lesions Lymph nodes: Cervical, supraclavicular, and axillary nodes normal. No abnormal inguinal nodes palpated Neurologic: Grossly normal   Pelvic: External genitalia:  no lesions              Urethra:  normal appearing urethra with no masses, tenderness or lesions              Bartholin's and Skene's: normal                 Vagina: normal appearing vagina with normal color and discharge, no lesions              Cervix: no cervical motion tenderness, no lesions and normal appearance              Pap taken: No. Bimanual Exam:  Uterus:  normal size, contour, position, consistency, mobility, non-tender and anteverted               Adnexa: normal adnexa and no mass, fullness, tenderness               Rectovaginal: Confirms               Anus:  normal sphincter tone, no lesions  Chaperone present: yes  A:  Well Woman with normal exam  Post menopausal no HRT  Hypothyroid/cholesterol with PCP management  Bilateral lower leg rash seen by Dermatology      P:   Reviewed health and wellness pertinent to exam  Aware of need to advise if vaginal bleeding or dryness change  Continue follow up with PCP as indicated  Discussed Aveeno Oatmeal bath to see if this helps with discomfort, continue with MD instructions  Pap smear: no   counseled on breast self exam, mammography screening, feminine hygiene, menopause, adequate intake of calcium and vitamin D, diet and exercise, Kegel's exercises  return annually or prn  An After Visit Summary was printed and given to the patient.

## 2019-08-31 NOTE — Patient Instructions (Signed)

## 2019-09-02 ENCOUNTER — Encounter: Payer: Self-pay | Admitting: Certified Nurse Midwife

## 2019-09-29 DIAGNOSIS — M65332 Trigger finger, left middle finger: Secondary | ICD-10-CM | POA: Diagnosis not present

## 2019-09-29 DIAGNOSIS — S83241A Other tear of medial meniscus, current injury, right knee, initial encounter: Secondary | ICD-10-CM | POA: Diagnosis not present

## 2019-10-09 DIAGNOSIS — M25562 Pain in left knee: Secondary | ICD-10-CM | POA: Diagnosis not present

## 2019-12-15 DIAGNOSIS — M1711 Unilateral primary osteoarthritis, right knee: Secondary | ICD-10-CM | POA: Diagnosis not present

## 2019-12-29 DIAGNOSIS — M65342 Trigger finger, left ring finger: Secondary | ICD-10-CM | POA: Diagnosis not present

## 2019-12-29 DIAGNOSIS — M1711 Unilateral primary osteoarthritis, right knee: Secondary | ICD-10-CM | POA: Diagnosis not present

## 2020-01-07 DIAGNOSIS — E039 Hypothyroidism, unspecified: Secondary | ICD-10-CM | POA: Diagnosis not present

## 2020-01-07 DIAGNOSIS — Z1331 Encounter for screening for depression: Secondary | ICD-10-CM | POA: Diagnosis not present

## 2020-01-07 DIAGNOSIS — K5900 Constipation, unspecified: Secondary | ICD-10-CM | POA: Diagnosis not present

## 2020-01-07 DIAGNOSIS — E669 Obesity, unspecified: Secondary | ICD-10-CM | POA: Diagnosis not present

## 2020-01-07 DIAGNOSIS — Z0000 Encounter for general adult medical examination without abnormal findings: Secondary | ICD-10-CM | POA: Diagnosis not present

## 2020-01-07 DIAGNOSIS — R7301 Impaired fasting glucose: Secondary | ICD-10-CM | POA: Diagnosis not present

## 2020-01-07 DIAGNOSIS — R82998 Other abnormal findings in urine: Secondary | ICD-10-CM | POA: Diagnosis not present

## 2020-01-07 DIAGNOSIS — D1779 Benign lipomatous neoplasm of other sites: Secondary | ICD-10-CM | POA: Diagnosis not present

## 2020-01-07 DIAGNOSIS — M1990 Unspecified osteoarthritis, unspecified site: Secondary | ICD-10-CM | POA: Diagnosis not present

## 2020-01-07 DIAGNOSIS — E785 Hyperlipidemia, unspecified: Secondary | ICD-10-CM | POA: Diagnosis not present

## 2020-01-14 DIAGNOSIS — Z1212 Encounter for screening for malignant neoplasm of rectum: Secondary | ICD-10-CM | POA: Diagnosis not present

## 2020-01-19 DIAGNOSIS — R1909 Other intra-abdominal and pelvic swelling, mass and lump: Secondary | ICD-10-CM | POA: Diagnosis not present

## 2020-01-28 DIAGNOSIS — R1909 Other intra-abdominal and pelvic swelling, mass and lump: Secondary | ICD-10-CM | POA: Diagnosis not present

## 2020-02-22 DIAGNOSIS — E669 Obesity, unspecified: Secondary | ICD-10-CM | POA: Diagnosis not present

## 2020-02-22 DIAGNOSIS — R7301 Impaired fasting glucose: Secondary | ICD-10-CM | POA: Diagnosis not present

## 2020-03-15 DIAGNOSIS — Z23 Encounter for immunization: Secondary | ICD-10-CM | POA: Diagnosis not present

## 2020-03-29 DIAGNOSIS — E65 Localized adiposity: Secondary | ICD-10-CM | POA: Diagnosis not present

## 2020-03-29 DIAGNOSIS — E079 Disorder of thyroid, unspecified: Secondary | ICD-10-CM | POA: Diagnosis not present

## 2020-03-29 DIAGNOSIS — E785 Hyperlipidemia, unspecified: Secondary | ICD-10-CM | POA: Diagnosis not present

## 2020-03-29 DIAGNOSIS — E039 Hypothyroidism, unspecified: Secondary | ICD-10-CM | POA: Diagnosis not present

## 2020-03-29 DIAGNOSIS — R1900 Intra-abdominal and pelvic swelling, mass and lump, unspecified site: Secondary | ICD-10-CM | POA: Insufficient documentation

## 2020-03-29 DIAGNOSIS — E119 Type 2 diabetes mellitus without complications: Secondary | ICD-10-CM | POA: Insufficient documentation

## 2020-03-29 DIAGNOSIS — E881 Lipodystrophy, not elsewhere classified: Secondary | ICD-10-CM | POA: Diagnosis not present

## 2020-04-01 DIAGNOSIS — E119 Type 2 diabetes mellitus without complications: Secondary | ICD-10-CM | POA: Diagnosis not present

## 2020-04-01 DIAGNOSIS — E65 Localized adiposity: Secondary | ICD-10-CM | POA: Diagnosis not present

## 2020-04-01 DIAGNOSIS — E039 Hypothyroidism, unspecified: Secondary | ICD-10-CM | POA: Diagnosis not present

## 2020-04-01 DIAGNOSIS — E881 Lipodystrophy, not elsewhere classified: Secondary | ICD-10-CM | POA: Diagnosis not present

## 2020-04-01 DIAGNOSIS — E079 Disorder of thyroid, unspecified: Secondary | ICD-10-CM | POA: Diagnosis not present

## 2020-04-01 DIAGNOSIS — E785 Hyperlipidemia, unspecified: Secondary | ICD-10-CM | POA: Diagnosis not present

## 2020-04-01 DIAGNOSIS — D225 Melanocytic nevi of trunk: Secondary | ICD-10-CM | POA: Diagnosis not present

## 2020-04-19 DIAGNOSIS — L738 Other specified follicular disorders: Secondary | ICD-10-CM | POA: Diagnosis not present

## 2020-04-19 DIAGNOSIS — Z85828 Personal history of other malignant neoplasm of skin: Secondary | ICD-10-CM | POA: Diagnosis not present

## 2020-04-19 DIAGNOSIS — C4441 Basal cell carcinoma of skin of scalp and neck: Secondary | ICD-10-CM | POA: Diagnosis not present

## 2020-04-19 DIAGNOSIS — L821 Other seborrheic keratosis: Secondary | ICD-10-CM | POA: Diagnosis not present

## 2020-04-19 DIAGNOSIS — D485 Neoplasm of uncertain behavior of skin: Secondary | ICD-10-CM | POA: Diagnosis not present

## 2020-04-19 DIAGNOSIS — L570 Actinic keratosis: Secondary | ICD-10-CM | POA: Diagnosis not present

## 2020-04-19 DIAGNOSIS — L814 Other melanin hyperpigmentation: Secondary | ICD-10-CM | POA: Diagnosis not present

## 2020-04-19 DIAGNOSIS — D2272 Melanocytic nevi of left lower limb, including hip: Secondary | ICD-10-CM | POA: Diagnosis not present

## 2020-04-19 DIAGNOSIS — D2239 Melanocytic nevi of other parts of face: Secondary | ICD-10-CM | POA: Diagnosis not present

## 2020-08-11 ENCOUNTER — Encounter: Payer: Self-pay | Admitting: Nurse Practitioner

## 2020-08-11 ENCOUNTER — Other Ambulatory Visit: Payer: Self-pay

## 2020-08-11 ENCOUNTER — Ambulatory Visit (INDEPENDENT_AMBULATORY_CARE_PROVIDER_SITE_OTHER): Payer: Medicare Other | Admitting: Nurse Practitioner

## 2020-08-11 VITALS — BP 114/76 | HR 70 | Temp 98.6°F | Resp 16 | Wt 159.0 lb

## 2020-08-11 DIAGNOSIS — N3090 Cystitis, unspecified without hematuria: Secondary | ICD-10-CM

## 2020-08-11 DIAGNOSIS — R8290 Unspecified abnormal findings in urine: Secondary | ICD-10-CM

## 2020-08-11 MED ORDER — SULFAMETHOXAZOLE-TRIMETHOPRIM 800-160 MG PO TABS: 1.0000 | ORAL_TABLET | Freq: Two times a day (BID) | ORAL | 0 refills | Status: DC

## 2020-08-11 NOTE — Progress Notes (Signed)
GYNECOLOGY  VISIT  CC:   Urine odor  HPI: 70 y.o. G79P2002 Divorced White or Caucasian female here for bad urine odor, urinary frequency.   Dark color, almost greenish color to urine,  Odor when urinates x 1 month. Feels like she has to go to the bathroom every 5 min.  Has a mild, intermittent pain on right pelvis, more noticeable in past month. Does not have irritation, does not have pain with urination. Denies vaginal symptoms, not sexually active Denies fever or chills, denies back pain  GYNECOLOGIC HISTORY: Patient's last menstrual period was 06/12/1999. Contraception: post menopausal &BTL Menopausal hormone therapy: none  Patient Active Problem List   Diagnosis Date Noted  . HYPERLIPIDEMIA 12/16/2008  . COUGH 12/16/2008    Past Medical History:  Diagnosis Date  . Cancer ([place])    skin, basal cell (forehead)  . Fibroid    small  . Migraines    with aura  . Thyroid disease    hypothyroid    Past Surgical History:  Procedure Laterality Date  . CATARACT EXTRACTION    . LIPOMA EXCISION  03/2020  . thyroid  nodule aspirated  4/12   cyst benign  . TUBAL LIGATION    . UMBILICAL HERNIA REPAIR     with BTL    MEDS:   Current Outpatient Medications on File Prior to Visit  Medication Sig Dispense Refill  . atorvastatin (LIPITOR) 10 MG tablet Take 10 mg by mouth daily.    Marland Kitchen BIOTIN PO Take 1 capsule by mouth daily.    . calcium carbonate (OS-CAL - DOSED IN MG OF ELEMENTAL CALCIUM) 1250 (500 Ca) MG tablet Take 1 tablet by mouth daily.    . celecoxib (CELEBREX) 200 MG capsule TAKE 1 CAPSULE BY MOUTH DAILY WITH FOOD FOR PAIN AND swelling    . Ginger, Zingiber officinalis, (GINGER ROOT PO) Take 1 capsule by mouth daily.    Marland Kitchen levothyroxine (SYNTHROID, LEVOTHROID) 88 MCG tablet Take 88 mcg by mouth daily before breakfast.    . Semaglutide,0.25 or 0.5MG /DOS, (OZEMPIC, 0.25 OR 0.5 MG/DOSE,) 2 MG/1.5ML SOPN inject 0.5mg  SUBCUTANEOUSLY ONCE WEEKLY.    . TURMERIC PO Take 1  capsule by mouth daily.    . valACYclovir (VALTREX) 500 MG tablet as needed.     No current facility-administered medications on file prior to visit.    ALLERGIES: Clindamycin/lincomycin  Family History  Problem Relation Age of Onset  . Other Mother        brain tumor      Review of Systems  Constitutional: Negative.   HENT: Negative.   Eyes: Negative.   Respiratory: Negative.   Cardiovascular: Negative.   Gastrointestinal: Negative.   Endocrine: Negative.   Genitourinary:       Bad urine odor, urinary frequency  Musculoskeletal: Negative.   Skin: Negative.   Allergic/Immunologic: Negative.   Neurological: Negative.   Hematological: Negative.   Psychiatric/Behavioral: Negative.     PHYSICAL EXAMINATION:    BP 114/76   Pulse 70   Temp 98.6 F (37 C) (Oral)   Resp 16   Wt 159 lb (72.1 kg)   LMP 06/12/1999   BMI 26.26 kg/m     General appearance: alert, cooperative, no acute distress  Neg CVAT  + nitrates, + WBC, 1+ blood in urine               Assessment/Plan: Bad odor of urine - Plan: Urinalysis,Complete w/RFL Culture  Cystitis - Plan: sulfamethoxazole-trimethoprim (BACTRIM DS) 800-160 MG tablet

## 2020-08-11 NOTE — Patient Instructions (Signed)
Urinary Tract Infection, Adult  A urinary tract infection (UTI) is an infection of any part of the urinary tract. The urinary tract includes the kidneys, ureters, bladder, and urethra. These organs make, store, and get rid of urine in the body. An upper UTI affects the ureters and kidneys. A lower UTI affects the bladder and urethra. What are the causes? Most urinary tract infections are caused by bacteria in your genital area around your urethra, where urine leaves your body. These bacteria grow and cause inflammation of your urinary tract. What increases the risk? You are more likely to develop this condition if:  You have a urinary catheter that stays in place.  You are not able to control when you urinate or have a bowel movement (incontinence).  You are female and you: ? Use a spermicide or diaphragm for birth control. ? Have low estrogen levels. ? Are pregnant.  You have certain genes that increase your risk.  You are sexually active.  You take antibiotic medicines.  You have a condition that causes your flow of urine to slow down, such as: ? An enlarged prostate, if you are female. ? Blockage in your urethra. ? A kidney stone. ? A nerve condition that affects your bladder control (neurogenic bladder). ? Not getting enough to drink, or not urinating often.  You have certain medical conditions, such as: ? Diabetes. ? A weak disease-fighting system (immunesystem). ? Sickle cell disease. ? Gout. ? Spinal cord injury. What are the signs or symptoms? Symptoms of this condition include:  Needing to urinate right away (urgency).  Frequent urination. This may include small amounts of urine each time you urinate.  Pain or burning with urination.  Blood in the urine.  Urine that smells bad or unusual.  Trouble urinating.  Cloudy urine.  Vaginal discharge, if you are female.  Pain in the abdomen or the lower back. You may also have:  Vomiting or a decreased  appetite.  Confusion.  Irritability or tiredness.  A fever or chills.  Diarrhea. The first symptom in older adults may be confusion. In some cases, they may not have any symptoms until the infection has worsened. How is this diagnosed? This condition is diagnosed based on your medical history and a physical exam. You may also have other tests, including:  Urine tests.  Blood tests.  Tests for STIs (sexually transmitted infections). If you have had more than one UTI, a cystoscopy or imaging studies may be done to determine the cause of the infections. How is this treated? Treatment for this condition includes:  Antibiotic medicine.  Over-the-counter medicines to treat discomfort.  Drinking enough water to stay hydrated. If you have frequent infections or have other conditions such as a kidney stone, you may need to see a health care provider who specializes in the urinary tract (urologist). In rare cases, urinary tract infections can cause sepsis. Sepsis is a life-threatening condition that occurs when the body responds to an infection. Sepsis is treated in the hospital with IV antibiotics, fluids, and other medicines. Follow these instructions at home: Medicines  Take over-the-counter and prescription medicines only as told by your health care provider.  If you were prescribed an antibiotic medicine, take it as told by your health care provider. Do not stop using the antibiotic even if you start to feel better. General instructions  Make sure you: ? Empty your bladder often and completely. Do not hold urine for long periods of time. ? Empty your bladder after   sex. ? Wipe from front to back after urinating or having a bowel movement if you are female. Use each tissue only one time when you wipe.  Drink enough fluid to keep your urine pale yellow.  Keep all follow-up visits. This is important.   Contact a health care provider if:  Your symptoms do not get better after 1-2  days.  Your symptoms go away and then return. Get help right away if:  You have severe pain in your back or your lower abdomen.  You have a fever or chills.  You have nausea or vomiting. Summary  A urinary tract infection (UTI) is an infection of any part of the urinary tract, which includes the kidneys, ureters, bladder, and urethra.  Most urinary tract infections are caused by bacteria in your genital area.  Treatment for this condition often includes antibiotic medicines.  If you were prescribed an antibiotic medicine, take it as told by your health care provider. Do not stop using the antibiotic even if you start to feel better.  Keep all follow-up visits. This is important. This information is not intended to replace advice given to you by your health care provider. Make sure you discuss any questions you have with your health care provider. Document Revised: 01/08/2020 Document Reviewed: 01/08/2020 Elsevier Patient Education  2021 [employer]  

## 2020-08-14 LAB — URINALYSIS, COMPLETE W/RFL CULTURE
Bilirubin Urine: NEGATIVE
Glucose, UA: NEGATIVE
Hyaline Cast: NONE SEEN /LPF
Ketones, ur: NEGATIVE
Leukocyte Esterase: NEGATIVE
Nitrites, Initial: POSITIVE — AB
Protein, ur: NEGATIVE
Specific Gravity, Urine: 1.025 (ref 1.001–1.03)
pH: 5.5 (ref 5.0–8.0)

## 2020-08-14 LAB — URINE CULTURE
MICRO NUMBER:: 11603372
SPECIMEN QUALITY:: ADEQUATE

## 2020-08-14 LAB — CULTURE INDICATED

## 2020-08-14 NOTE — Progress Notes (Signed)
Please notify that the culture confirmed a bladder infection as we suspected. She was started on Bactrim and the culture shows that should be an effective antibiotic for the bacteria. Please notify if she is not feeling better.

## 2020-09-05 ENCOUNTER — Ambulatory Visit: Payer: Medicare Other | Admitting: Obstetrics and Gynecology

## 2020-09-05 NOTE — Progress Notes (Deleted)
GYNECOLOGY  VISIT   HPI: 70 y.o.   Divorced  Caucasian  female   G2P2002 with Patient's last menstrual period was 06/12/1999.   here for Breast and Pelvic exam.    Patient sees Dr.Avva for annual labs.  GYNECOLOGIC HISTORY: Patient's last menstrual period was 06/12/1999. Contraception:  Tubal Menopausal hormone therapy:  *** Last mammogram:  ***11-27-18 Neg/BiRads1 Last pap smear: 08-26-18 08-26-2018 no endos due to atrophy, neg HPV HR neg, 08-18-15 Neg, 08-11-13 Neg        OB History    Gravida  2   Para  2   Term  2   Preterm      AB      Living  2     SAB      IAB      Ectopic      Multiple      Live Births  2              Patient Active Problem List   Diagnosis Date Noted  . HYPERLIPIDEMIA 12/16/2008  . COUGH 12/16/2008    Past Medical History:  Diagnosis Date  . Cancer ([place])    skin, basal cell (forehead)  . Fibroid    small  . Migraines    with aura  . Thyroid disease    hypothyroid    Past Surgical History:  Procedure Laterality Date  . CATARACT EXTRACTION    . LIPOMA EXCISION  03/2020  . thyroid  nodule aspirated  4/12   cyst benign  . TUBAL LIGATION    . UMBILICAL HERNIA REPAIR     with BTL    Current Outpatient Medications  Medication Sig Dispense Refill  . atorvastatin (LIPITOR) 10 MG tablet Take 10 mg by mouth daily.    Marland Kitchen BIOTIN PO Take 1 capsule by mouth daily.    . calcium carbonate (OS-CAL - DOSED IN MG OF ELEMENTAL CALCIUM) 1250 (500 Ca) MG tablet Take 1 tablet by mouth daily.    . celecoxib (CELEBREX) 200 MG capsule TAKE 1 CAPSULE BY MOUTH DAILY WITH FOOD FOR PAIN AND swelling    . Ginger, Zingiber officinalis, (GINGER ROOT PO) Take 1 capsule by mouth daily.    Marland Kitchen levothyroxine (SYNTHROID, LEVOTHROID) 88 MCG tablet Take 88 mcg by mouth daily before breakfast.    . Semaglutide,0.25 or 0.5MG /DOS, (OZEMPIC, 0.25 OR 0.5 MG/DOSE,) 2 MG/1.5ML SOPN inject 0.5mg  SUBCUTANEOUSLY ONCE WEEKLY.    . sulfamethoxazole-trimethoprim (BACTRIM  DS) 800-160 MG tablet Take 1 tablet by mouth 2 (two) times daily. One PO BID x 3 days 6 tablet 0  . TURMERIC PO Take 1 capsule by mouth daily.    . valACYclovir (VALTREX) 500 MG tablet as needed.     No current facility-administered medications for this visit.     ALLERGIES: Clindamycin/lincomycin  Family History  Problem Relation Age of Onset  . Other Mother        brain tumor    Social History   Socioeconomic History  . Marital status: Divorced    Spouse name: Not on file  . Number of children: Not on file  . Years of education: Not on file  . Highest education level: Not on file  Occupational History  . Not on file  Tobacco Use  . Smoking status: Never Smoker  . Smokeless tobacco: Never Used  Substance and Sexual Activity  . Alcohol use: Yes    Comment: 1 month  . Drug use: No  . Sexual activity: Not  Currently    Partners: Male    Birth control/protection: Surgical    Comment: btl  Other Topics Concern  . Not on file  Social History Narrative  . Not on file   Social Determinants of Health   Financial Resource Strain: Not on file  Food Insecurity: Not on file  Transportation Needs: Not on file  Physical Activity: Not on file  Stress: Not on file  Social Connections: Not on file  Intimate Partner Violence: Not on file    Review of Systems  PHYSICAL EXAMINATION:    LMP 06/12/1999     General appearance: alert, cooperative and appears stated age Head: Normocephalic, without obvious abnormality, atraumatic Neck: no adenopathy, supple, symmetrical, trachea midline and thyroid normal to inspection and palpation Lungs: clear to auscultation bilaterally Breasts: normal appearance, no masses or tenderness, No nipple retraction or dimpling, No nipple discharge or bleeding, No axillary or supraclavicular adenopathy Heart: regular rate and rhythm Abdomen: soft, non-tender, no masses,  no organomegaly Extremities: extremities normal, atraumatic, no cyanosis or  edema Skin: Skin color, texture, turgor normal. No rashes or lesions Lymph nodes: Cervical, supraclavicular, and axillary nodes normal. No abnormal inguinal nodes palpated Neurologic: Grossly normal  Pelvic: External genitalia:  no lesions              Urethra:  normal appearing urethra with no masses, tenderness or lesions              Bartholins and Skenes: normal                 Vagina: normal appearing vagina with normal color and discharge, no lesions              Cervix: no lesions                Bimanual Exam:  Uterus:  normal size, contour, position, consistency, mobility, non-tender              Adnexa: no mass, fullness, tenderness              Rectal exam: {yes no:314532}.  Confirms.              Anus:  normal sphincter tone, no lesions  Chaperone was present for exam.  ASSESSMENT     PLAN     An After Visit Summary was printed and given to the patient.  ______ minutes face to face time of which over 50% was spent in counseling.

## 2020-11-10 NOTE — Progress Notes (Signed)
70 y.o. G63P2002 Divorced Caucasian female here for annual exam.    Has arthritis in her right knee.  Had fluid drained off of her knee.  Taking a steroid dose pack.   Had E Coli UTI 08/11/20.   2 daughters, 5 grandchildren.  Does commercial residential real estate.  Partner passed away 7 years ago.  Not sexually active.   PCP:   Prince Solian, MD  Patient's last menstrual period was 06/12/1999.           Sexually active: No The current method of family planning is tubal ligation.  Exercising: Yes. walking Smoker: No  Health Maintenance: Pap:   08-18-15 neg, 08-26-2018 no endos due to atrophy, neg HPV HR neg History of abnormal Pap: No MMG:    11-27-2018 category b density birads 1:neg.  Has an appointment for mammogram on June 27.  Colonoscopy:   2007, did stool kit done 39yr ago, pcp manages.  Did Cologuard about 5 years ago? BMD:   2016 normal f/u 3-518yr pcp manages TDaP: 2018 HIV: No Hep C: No Screening Labs: PCP   reports that she has never smoked. She has never used smokeless tobacco. She reports current alcohol use of about 1.0 standard drink of alcohol per week. She reports that she does not use drugs.  Past Medical History:  Diagnosis Date   [place]   skin, basal cell (forehead)   Fibroid    small   HSV-1 infection    Migraines    with aura   Thyroid disease    hypothyroid    Past Surgical History:  Procedure Laterality Date   CATARACT EXTRACTION     LIPOMA EXCISION  03/2020   thyroid  nodule aspirated  4/12   cyst benign   TUBAL LIGATION     UMBILICAL HERNIA REPAIR     with BTL    Current Outpatient Medications  Medication Sig Dispense Refill   atorvastatin (LIPITOR) 10 MG tablet Take 10 mg by mouth daily.     BIOTIN PO Take 1 capsule by mouth daily.     calcium carbonate (OS-CAL - DOSED IN MG OF ELEMENTAL CALCIUM) 1250 (500 Ca) MG tablet Take 1 tablet by mouth daily.     celecoxib (CELEBREX) 200 MG capsule TAKE 1 CAPSULE BY MOUTH DAILY WITH  FOOD FOR PAIN AND swelling     Ginger, Zingiber officinalis, (GINGER ROOT PO) Take 1 capsule by mouth daily.     levothyroxine (SYNTHROID, LEVOTHROID) 88 MCG tablet Take 88 mcg by mouth daily before breakfast.     predniSONE (STERAPRED UNI-PAK 21 TAB) 10 MG (21) TBPK tablet Take by mouth daily.     Semaglutide,0.25 or 0.5MG/DOS, (OZEMPIC, 0.25 OR 0.5 MG/DOSE,) 2 MG/1.5ML SOPN inject 0.67m73mUBCUTANEOUSLY ONCE WEEKLY.     TURMERIC PO Take 1 capsule by mouth daily.     valACYclovir (VALTREX) 500 MG tablet as needed. (Patient not taking: Reported on 11/24/2020)     No current facility-administered medications for this visit.    Family History  Problem Relation Age of Onset   Other Mother        brain tumor    Review of Systems  Constitutional: Negative.   HENT: Negative.    Eyes: Negative.   Respiratory: Negative.    Cardiovascular: Negative.   Gastrointestinal: Negative.   Endocrine: Negative.   Genitourinary: Negative.   Musculoskeletal: Negative.   Skin: Negative.   Allergic/Immunologic: Negative.   Neurological: Negative.   Hematological: Negative.  Psychiatric/Behavioral: Negative.     Exam:   BP 134/86   Ht 5' 5"  (1.651 m)   Wt 154 lb (69.9 kg)   LMP 06/12/1999   BMI 25.63 kg/m     General appearance: alert, cooperative and appears stated age Head: normocephalic, without obvious abnormality, atraumatic Neck: no adenopathy, supple, symmetrical, trachea midline and thyroid normal to inspection and palpation Lungs: clear to auscultation bilaterally Breasts: normal appearance, no masses or tenderness, No nipple retraction or dimpling, No nipple discharge or bleeding, No axillary adenopathy Heart: regular rate and rhythm Abdomen: soft, non-tender; no masses, no organomegaly Extremities: extremities normal, atraumatic, no cyanosis or edema Skin: skin color, texture, turgor normal. No rashes or lesions Lymph nodes: cervical, supraclavicular, and axillary nodes  normal. Neurologic: grossly normal  Pelvic: External genitalia:  no lesions              No abnormal inguinal nodes palpated.              Urethra:  normal appearing urethra with no masses, tenderness or lesions              Bartholins and Skenes: normal                 Vagina: normal appearing vagina with normal color and discharge, no lesions              Cervix: no lesions              Pap taken: Yes.   Bimanual Exam:  Uterus:  normal size, contour, position, consistency, mobility, non-tender              Adnexa: no mass, fullness, tenderness              Rectal exam: Yes.  .  Confirms.              Anus:  normal sphincter tone, no lesions  Chaperone was present for exam.  Assessment:   Well woman visit with normal exam. Cervical cancer screening.  Pelvic exam without abnormal findings.  Rectal exam. Screening breast exam.  Hx HSV.   Plan: Mammogram screening discussed. Self breast awareness reviewed. Pap and reflex HR HPV. Guidelines for Calcium, Vitamin D, regular exercise program including cardiovascular and weight bearing exercise. Not taking Valtrex.  Next BMD in 2026.  Fu in 2 years and prn.   20 min total time was spent for this patient encounter, including preparation, face-to-face counseling with the patient, coordination of care, and documentation of the encounter.

## 2020-11-24 ENCOUNTER — Encounter: Payer: Self-pay | Admitting: Obstetrics and Gynecology

## 2020-11-24 ENCOUNTER — Ambulatory Visit (INDEPENDENT_AMBULATORY_CARE_PROVIDER_SITE_OTHER): Payer: Medicare Other | Admitting: Obstetrics and Gynecology

## 2020-11-24 ENCOUNTER — Other Ambulatory Visit: Payer: Self-pay

## 2020-11-24 ENCOUNTER — Other Ambulatory Visit (HOSPITAL_COMMUNITY)
Admission: RE | Admit: 2020-11-24 | Discharge: 2020-11-24 | Disposition: A | Discharge status: Home / Self Care | Payer: Medicare Other | Source: Ambulatory Visit | Attending: Obstetrics and Gynecology | Admitting: Obstetrics and Gynecology

## 2020-11-24 VITALS — BP 134/86 | Ht 65.0 in | Wt 154.0 lb

## 2020-11-24 DIAGNOSIS — Z9289 Personal history of other medical treatment: Secondary | ICD-10-CM

## 2020-11-24 DIAGNOSIS — Z1239 Encounter for other screening for malignant neoplasm of breast: Secondary | ICD-10-CM

## 2020-11-24 DIAGNOSIS — Z008 Encounter for other general examination: Secondary | ICD-10-CM

## 2020-11-24 DIAGNOSIS — Z124 Encounter for screening for malignant neoplasm of cervix: Secondary | ICD-10-CM | POA: Diagnosis not present

## 2020-11-24 DIAGNOSIS — Z01419 Encounter for gynecological examination (general) (routine) without abnormal findings: Secondary | ICD-10-CM

## 2020-11-27 NOTE — Patient Instructions (Signed)

## 2020-11-29 LAB — CYTOLOGY - PAP: Diagnosis: NEGATIVE

## 2021-05-02 ENCOUNTER — Encounter: Payer: Self-pay | Admitting: Gastroenterology

## 2021-06-07 ENCOUNTER — Encounter: Payer: Self-pay | Admitting: *Deleted

## 2021-06-22 ENCOUNTER — Ambulatory Visit (AMBULATORY_SURGERY_CENTER): Payer: BLUE CROSS/BLUE SHIELD | Admitting: *Deleted

## 2021-06-22 VITALS — Ht 66.0 in | Wt 152.0 lb

## 2021-06-22 DIAGNOSIS — Z1211 Encounter for screening for malignant neoplasm of colon: Secondary | ICD-10-CM

## 2021-06-22 NOTE — Progress Notes (Signed)

## 2021-06-28 DIAGNOSIS — M1711 Unilateral primary osteoarthritis, right knee: Secondary | ICD-10-CM | POA: Diagnosis not present

## 2021-07-06 ENCOUNTER — Encounter: Payer: Medicare Other | Admitting: Gastroenterology

## 2021-07-07 ENCOUNTER — Ambulatory Visit (AMBULATORY_SURGERY_CENTER): Payer: Medicare HMO | Admitting: Internal Medicine

## 2021-07-07 ENCOUNTER — Encounter: Payer: Self-pay | Admitting: Internal Medicine

## 2021-07-07 VITALS — BP 139/69 | HR 62 | Temp 98.0°F | Resp 17 | Ht 65.0 in | Wt 152.0 lb

## 2021-07-07 DIAGNOSIS — Z1211 Encounter for screening for malignant neoplasm of colon: Secondary | ICD-10-CM

## 2021-07-07 DIAGNOSIS — Z538 Procedure and treatment not carried out for other reasons: Secondary | ICD-10-CM

## 2021-07-07 MED ORDER — FLEET ENEMA 7-19 GM/118ML RE ENEM
1.0000 | ENEMA | Freq: Once | RECTAL | Status: AC
  Administered 2021-07-07: 1 via RECTAL

## 2021-07-07 MED ORDER — SODIUM CHLORIDE 0.9 % IV SOLN: 500.0000 mL | Freq: Once | INTRAVENOUS | Status: DC

## 2021-07-07 NOTE — Progress Notes (Signed)
Pt's states no medical or surgical changes since previsit or office visit.  Vitals Wylie 

## 2021-07-07 NOTE — Patient Instructions (Signed)
YOU HAD AN ENDOSCOPIC PROCEDURE TODAY AT THE [facility]:   Refer to the procedure report that was given to you for any specific questions about what was found during the examination.  If the procedure report does not answer your questions, please call your gastroenterologist to clarify.  If you requested that your care partner not be given the details of your procedure findings, then the procedure report has been included in a sealed envelope for you to review at your convenience later.  YOU SHOULD EXPECT: Some feelings of bloating in the abdomen. Passage of more gas than usual.  Walking can help get rid of the air that was put into your GI tract during the procedure and reduce the bloating. If you had a lower endoscopy (such as a colonoscopy or flexible sigmoidoscopy) you may notice spotting of blood in your stool or on the toilet paper. If you underwent a bowel prep for your procedure, you may not have a normal bowel movement for a few days.  Please Note:  You might notice some irritation and congestion in your nose or some drainage.  This is from the oxygen used during your procedure.  There is no need for concern and it should clear up in a day or so.  SYMPTOMS TO REPORT IMMEDIATELY:   Following lower endoscopy (colonoscopy or flexible sigmoidoscopy):  Excessive amounts of blood in the stool  Significant tenderness or worsening of abdominal pains  Swelling of the abdomen that is new, acute  Fever of 100F or higher  For urgent or emergent issues, a gastroenterologist can be reached at any hour by calling [phone]. Do not use MyChart messaging for urgent concerns.    DIET:  We do recommend a small meal at first, but then you may proceed to your regular diet.  Drink plenty of fluids but you should avoid alcoholic beverages for 24 hours.  ACTIVITY:  You should plan to take it easy for the rest of today and you should NOT DRIVE or use heavy machinery until tomorrow (because  of the sedation medicines used during the test).    FOLLOW UP: Our staff will call the number listed on your records 48-72 hours following your procedure to check on you and address any questions or concerns that you may have regarding the information given to you following your procedure. If we do not reach you, we will leave a message.  We will attempt to reach you two times.  During this call, we will ask if you have developed any symptoms of COVID 19. If you develop any symptoms (ie: fever, flu-like symptoms, shortness of breath, cough etc.) before then, please call [phone].  If you test positive for Covid 19 in the 2 weeks post procedure, please call and report this information to us.    If any biopsies were taken you will be contacted by phone or by letter within the next 1-3 weeks.  Please call us at [phone] if you have not heard about the biopsies in 3 weeks.    SIGNATURES/CONFIDENTIALITY: You and/or your care partner have signed paperwork which will be entered into your electronic medical record.  These signatures attest to the fact that that the information above on your After Visit Summary has been reviewed and is understood.  Full responsibility of the confidentiality of this discharge information lies with you and/or your care-partner. 

## 2021-07-07 NOTE — Op Note (Signed)
[facility] Patient Name: Donna Hines Procedure Date: 07/07/2021 8:49 AM MRN: [id number] Endoscopist: Sonny Masters "Donna Hines ,  Age: 71 Referring MD:  Date of Birth: Mar 27, 1951 Gender: Female Account #: [id number] Procedure:                Colonoscopy Indications:              Screening for colorectal malignant neoplasm Medicines:                Monitored Anesthesia Care Procedure:                Pre-Anesthesia Assessment:                           - Prior to the procedure, a History and Physical                            was performed, and patient medications and                            allergies were reviewed. The patient's tolerance of                            previous anesthesia was also reviewed. The risks                            and benefits of the procedure and the sedation                            options and risks were discussed with the patient.                            All questions were answered, and informed consent                            was obtained. Prior Anticoagulants: The patient has                            taken no previous anticoagulant or antiplatelet                            agents. ASA Grade Assessment: III - A patient with                            severe systemic disease. After reviewing the risks                            and benefits, the patient was deemed in                            satisfactory condition to undergo the procedure.                           After obtaining informed consent, the colonoscope  was passed under direct vision. Throughout the                            procedure, the patient's blood pressure, pulse, and                            oxygen saturations were monitored continuously. The                            PCF-HQ190L Colonoscope was introduced through the                            anus and advanced to the the descending colon. The                             colonoscopy was performed without difficulty. The                            patient tolerated the procedure well. The quality                            of the bowel preparation was poor. No anatomical                            landmarks were photographed. Scope In: 9:25:15 AM Scope Out: 9:32:56 AM Total Procedure Duration: 0 hours 7 minutes 41 seconds  Findings:                 A large amount of copious quantities of stool was                            found in the rectum, in the sigmoid colon and in                            the descending colon, interfering with                            visualization.                           Diverticula were found in the sigmoid colon. Complications:            No immediate complications. Estimated Blood Loss:     Estimated blood loss: none. Impression:               - Preparation of the colon was poor.                           - Stool in the rectum, in the sigmoid colon and in                            the descending colon.                           - Diverticulosis in the sigmoid colon.                           -  No specimens collected. Recommendation:           - Discharge patient to home (with escort).                           - Repeat colonoscopy at the next available                            appointment with a two day preparation because the                            bowel preparation was poor. Alternatively patient                            could consider doing FIT or Cologuard stool tests                            for colon cancer screening.                           - The findings and recommendations were discussed                            with the patient. Sonny Masters "Donna Hines,  07/07/2021 9:37:26 AM

## 2021-07-07 NOTE — Progress Notes (Signed)
A and O x3. Report to RN. Tolerated MAC anesthesia well.

## 2021-07-07 NOTE — Progress Notes (Signed)
GASTROENTEROLOGY PROCEDURE H&P NOTE   Primary Care Physician: Prince Solian, MD    Reason for Procedure:   Colon cancer screening  Plan:    Colonoscopy  Patient is appropriate for endoscopic procedure(s) in the ambulatory ([place]) setting.  The nature of the procedure, as well as the risks, benefits, and alternatives were carefully and thoroughly reviewed with the patient. Ample time for discussion and questions allowed. The patient understood, was satisfied, and agreed to proceed.     HPI: Donna Hines is a 71 y.o. female who presents for colonoscopy for colon cancer screening. Denies blood in stools, changes in bowel habits, weight loss. Denies fam hx of colon cancer. Her last colonoscopy was on 2007 with no polyps but was a difficult procedure; appendiceal orifice was not able to be visualized.  Past Medical History:  Diagnosis Date   Cancer ([place])    skin, basal cell (forehead)   Fibroid    small   HSV-1 infection    Hyperlipidemia    Migraines    with aura   Thyroid disease    hypothyroid    Past Surgical History:  Procedure Laterality Date   CATARACT EXTRACTION     COLONOSCOPY     LIPOMA EXCISION  03/2020   thyroid  nodule aspirated  09/2010   cyst benign   TUBAL LIGATION     UMBILICAL HERNIA REPAIR     with BTL    Prior to Admission medications   Medication Sig Start Date End Date Taking? Authorizing Provider  atorvastatin (LIPITOR) 10 MG tablet Take 10 mg by mouth daily.    [provider]  BIOTIN PO Take 1 capsule by mouth daily. Patient not taking: Reported on 06/22/2021    [provider]  calcium carbonate (OS-CAL - DOSED IN MG OF ELEMENTAL CALCIUM) 1250 (500 Ca) MG tablet Take 1 tablet by mouth daily. Patient not taking: Reported on 06/22/2021    [provider]  Ginger, Zingiber officinalis, (GINGER ROOT PO) Take 1 capsule by mouth daily. Patient not taking: Reported on 06/22/2021    [provider]   levothyroxine (SYNTHROID, LEVOTHROID) 88 MCG tablet Take 88 mcg by mouth daily before breakfast.    [provider]  meloxicam (MOBIC) 7.5 MG tablet Take 7.5 mg by mouth daily.    [provider]  OVER THE COUNTER MEDICATION Nutrafol daily    [provider]  Semaglutide,0.25 or 0.5MG /DOS, (OZEMPIC, 0.25 OR 0.5 MG/DOSE,) 2 MG/1.5ML SOPN inject 0.5mg  SUBCUTANEOUSLY ONCE WEEKLY. 03/05/20   [provider]  valACYclovir (VALTREX) 500 MG tablet as needed. Patient not taking: Reported on 11/24/2020 07/21/18   [provider]    Current Outpatient Medications  Medication Sig Dispense Refill   atorvastatin (LIPITOR) 10 MG tablet Take 10 mg by mouth daily.     BIOTIN PO Take 1 capsule by mouth daily. (Patient not taking: Reported on 06/22/2021)     calcium carbonate (OS-CAL - DOSED IN MG OF ELEMENTAL CALCIUM) 1250 (500 Ca) MG tablet Take 1 tablet by mouth daily. (Patient not taking: Reported on 06/22/2021)     Ginger, Zingiber officinalis, (GINGER ROOT PO) Take 1 capsule by mouth daily. (Patient not taking: Reported on 06/22/2021)     levothyroxine (SYNTHROID, LEVOTHROID) 88 MCG tablet Take 88 mcg by mouth daily before breakfast.     meloxicam (MOBIC) 7.5 MG tablet Take 7.5 mg by mouth daily.     OVER THE COUNTER MEDICATION Nutrafol daily     Semaglutide,0.25  or 0.5MG /DOS, (OZEMPIC, 0.25 OR 0.5 MG/DOSE,) 2 MG/1.5ML SOPN inject 0.5mg  SUBCUTANEOUSLY ONCE WEEKLY.     valACYclovir (VALTREX) 500 MG tablet as needed. (Patient not taking: Reported on 11/24/2020)     Current Facility-Administered Medications  Medication Dose Route Frequency Provider Last Rate Last Admin   sodium phosphate (FLEET) 7-19 GM/118ML enema 1 enema  1 enema Rectal Once Sharyn Creamer, MD       sodium phosphate (FLEET) 7-19 GM/118ML enema 1 enema  1 enema Rectal Once Sharyn Creamer, MD        Allergies as of 07/07/2021 - Review Complete 07/07/2021  Allergen Reaction Noted    Clindamycin/lincomycin Rash 08/05/2013    Family History  Problem Relation Age of Onset   Other Mother        brain tumor   Colon cancer Neg Hx    Colon polyps Neg Hx    Esophageal cancer Neg Hx    Rectal cancer Neg Hx    Stomach cancer Neg Hx     Social History   Socioeconomic History   Marital status: Divorced    Spouse name: Not on file   Number of children: Not on file   Years of education: Not on file   Highest education level: Not on file  Occupational History   Not on file  Tobacco Use   Smoking status: Never   Smokeless tobacco: Never  Vaping Use   Vaping Use: Never used  Substance and Sexual Activity   Alcohol use: Yes    Alcohol/week: 1.0 standard drink    Types: 1 Standard drinks or equivalent per week    Comment: 1 month   Drug use: Never   Sexual activity: Not Currently    Partners: Male    Birth control/protection: Surgical, Post-menopausal    Comment: btl  Other Topics Concern   Not on file  Social History Narrative   Not on file   Social Determinants of Health   Financial Resource Strain: Not on file  Food Insecurity: Not on file  Transportation Needs: Not on file  Physical Activity: Not on file  Stress: Not on file  Social Connections: Not on file  Intimate Partner Violence: Not on file    Physical Exam: Vital signs in last 24 hours: Temp 98 F (36.7 C) (Temporal)    LMP 06/12/1999  GEN: NAD EYE: Sclerae anicteric ENT: MMM CV: Non-tachycardic Pulm: No increased work of breathing GI: Soft, NT/ND NEURO:  Alert & Oriented   [name], MD [facility]  07/07/2021 9:09 AM

## 2021-07-11 ENCOUNTER — Telehealth: Payer: Self-pay

## 2021-07-11 NOTE — Telephone Encounter (Signed)
°  Follow up Call-  Call back number 07/07/2021  Post procedure Call Back phone  # [phone]  Permission to leave phone message Yes  Some recent data might be hidden     Patient questions:  Do you have a fever, pain , or abdominal swelling? No. Pain Score  0 *  Have you tolerated food without any problems? Yes.    Have you been able to return to your normal activities? Yes.    Do you have any questions about your discharge instructions: Diet   No. Medications  No. Follow up visit  No.  Do you have questions or concerns about your Care? No.  Actions: * If pain score is 4 or above: No action needed, pain <4.  Have you developed a fever since your procedure? no  2.   Have you had an respiratory symptoms (SOB or cough) since your procedure? no  3.   Have you tested positive for COVID 19 since your procedure no  4.   Have you had any family members/close contacts diagnosed with the COVID 19 since your procedure?  no   If yes to any of these questions please route to Joylene John, RN and Joella Prince, RN

## 2021-07-27 ENCOUNTER — Encounter: Payer: Self-pay | Admitting: Internal Medicine

## 2021-07-27 ENCOUNTER — Ambulatory Visit (INDEPENDENT_AMBULATORY_CARE_PROVIDER_SITE_OTHER): Payer: Medicare HMO | Admitting: Internal Medicine

## 2021-07-27 VITALS — BP 120/70 | HR 81 | Ht 65.0 in | Wt 152.0 lb

## 2021-07-27 DIAGNOSIS — Z1211 Encounter for screening for malignant neoplasm of colon: Secondary | ICD-10-CM

## 2021-07-27 MED ORDER — PLENVU 140 G PO SOLR: 1.0000 | Freq: Once | ORAL | 0 refills | Status: AC

## 2021-07-27 MED ORDER — ONDANSETRON 4 MG PO TBDP: 4.0000 mg | ORAL_TABLET | Freq: Three times a day (TID) | ORAL | 0 refills | Status: AC | PRN

## 2021-07-27 NOTE — Patient Instructions (Signed)
You have been scheduled for a colonoscopy. Please follow written instructions given to you at your visit today.  Please pick up your prep supplies at the pharmacy within the next 1-3 days. If you use inhalers (even only as needed), please bring them with you on the day of your procedure.  We have sent the following medications to your pharmacy for you to pick up at your convenience: zofran.  Increase your water intake to 8 cups of water daily. Walk at least 30 minutes daily.  Have at least 25 g in your daily fiber.   The  GI providers would like to encourage you to use [facility] to communicate with providers for non-urgent requests or questions.  Due to long hold times on the telephone, sending your provider a message by [facility] may be a faster and more efficient way to get a response.  Please allow 48 business hours for a response.  Please remember that this is for non-urgent requests.   Due to recent changes in healthcare laws, you may see the results of your imaging and laboratory studies on MyChart before your provider has had a chance to review them.  We understand that in some cases there may be results that are confusing or concerning to you. Not all laboratory results come back in the same time frame and the provider may be waiting for multiple results in order to interpret others.  Please give [place] 48 hours in order for your provider to thoroughly review all the results before contacting the office for clarification of your results.

## 2021-07-27 NOTE — Progress Notes (Signed)
Chief Complaint: Constipation  HPI : 71 year old female with history of constipation, migraines, hypothyroidism presents for constipation  Patient recently came in for a colonoscopy on 07/07/21, and was found to have an inadequate prep.  She had difficulty tolerating the MiraLAX prep that was given due to issues with nausea and vomiting.  In addition, she states that she was having bowel movements once every 1.5 weeks prior to her colonoscopy procedure.  Since her colonoscopy procedure, she has been having more frequent BMs.  She has been experiencing small worm like poops once a day.  She is not taking any laxative therapy at this time.  She has noticed that since she has been on Ozempic, she has been more constipated than usual.   Past Medical History:  Diagnosis Date   Cancer ([place])    skin, basal cell (forehead)   Fibroid    small   HSV-1 infection    Hyperlipidemia    Migraines    with aura   Thyroid disease    hypothyroid     Past Surgical History:  Procedure Laterality Date   CATARACT EXTRACTION     COLONOSCOPY     LIPOMA EXCISION  03/2020   thyroid  nodule aspirated  09/2010   cyst benign   TUBAL LIGATION     UMBILICAL HERNIA REPAIR     with BTL   Family History  Problem Relation Age of Onset   Other Mother        brain tumor   Colon cancer Neg Hx    Colon polyps Neg Hx    Esophageal cancer Neg Hx    Rectal cancer Neg Hx    Stomach cancer Neg Hx    Social History   Tobacco Use   Smoking status: Never   Smokeless tobacco: Never  Vaping Use   Vaping Use: Never used  Substance Use Topics   Alcohol use: Yes    Alcohol/week: 1.0 standard drink    Types: 1 Standard drinks or equivalent per week    Comment: 1 month   Drug use: Never   Current Outpatient Medications  Medication Sig Dispense Refill   atorvastatin (LIPITOR) 10 MG tablet Take 10 mg by mouth daily.     BIOTIN PO Take 1 capsule by mouth daily.     calcium carbonate (OS-CAL - DOSED IN MG OF  ELEMENTAL CALCIUM) 1250 (500 Ca) MG tablet Take 1 tablet by mouth daily.     Ginger, Zingiber officinalis, (GINGER ROOT PO) Take 1 capsule by mouth daily.     levothyroxine (SYNTHROID, LEVOTHROID) 88 MCG tablet Take 88 mcg by mouth daily before breakfast.     meloxicam (MOBIC) 7.5 MG tablet Take 7.5 mg by mouth daily.     OVER THE COUNTER MEDICATION Nutrafol daily     Semaglutide,0.25 or 0.5MG /DOS, (OZEMPIC, 0.25 OR 0.5 MG/DOSE,) 2 MG/1.5ML SOPN inject 0.5mg  SUBCUTANEOUSLY ONCE WEEKLY.     valACYclovir (VALTREX) 500 MG tablet as needed.     No current facility-administered medications for this visit.   Allergies  Allergen Reactions   Clindamycin/Lincomycin Rash     Review of Systems: All systems reviewed and negative except where noted in HPI.   Physical Exam: BP 120/70    Pulse 81    Ht 5\' 5"  (1.651 m)    Wt 152 lb (68.9 kg)    LMP 06/12/1999    BMI 25.29 kg/m  Constitutional: Pleasant,well-developed, female in no acute distress. HEENT: Normocephalic and atraumatic. Conjunctivae are normal.  No scleral icterus. Cardiovascular: Normal rate, regular rhythm.  Pulmonary/chest: Effort normal and breath sounds normal. No wheezing, rales or rhonchi. Abdominal: Soft, nondistended, nontender.  Quiet bowel sounds. There are no masses palpable. No hepatomegaly. Extremities: No edema Neurological: Alert and oriented to person place and time. Skin: Skin is warm and dry. No rashes noted. Psychiatric: Normal mood and affect. Behavior is normal.  Colonoscopy 07/07/21: - Preparation of the colon was poor. - Stool in the rectum, in the sigmoid colon and in the descending colon. - Diverticulosis in the sigmoid colon. - No specimens collected.  ASSESSMENT AND PLAN: Constipation Patient presents with constipation issues that were discovered after she recently had a colonoscopy with an inadequate prep.  I offered her the option of starting daily MiraLAX to help with her constipation issues, but the  patient was not interested at this time.  Thus we will plan for instead for a 2-day prep before her next colonoscopy and we will give her a prescription for Zofran to try and prevent any issues with nausea and vomiting. - Zofran as needed - Colonoscopy [facility] with 2-day prep  [name], MD

## 2021-07-31 ENCOUNTER — Encounter: Payer: Self-pay | Admitting: Internal Medicine

## 2021-08-04 ENCOUNTER — Ambulatory Visit (AMBULATORY_SURGERY_CENTER): Payer: Medicare HMO | Admitting: Internal Medicine

## 2021-08-04 ENCOUNTER — Other Ambulatory Visit: Payer: Self-pay

## 2021-08-04 ENCOUNTER — Encounter: Payer: Self-pay | Admitting: Internal Medicine

## 2021-08-04 VITALS — BP 134/72 | HR 78 | Temp 96.8°F | Resp 14 | Ht 65.0 in | Wt 152.0 lb

## 2021-08-04 DIAGNOSIS — D123 Benign neoplasm of transverse colon: Secondary | ICD-10-CM | POA: Diagnosis not present

## 2021-08-04 DIAGNOSIS — D122 Benign neoplasm of ascending colon: Secondary | ICD-10-CM | POA: Diagnosis not present

## 2021-08-04 DIAGNOSIS — D120 Benign neoplasm of cecum: Secondary | ICD-10-CM | POA: Diagnosis not present

## 2021-08-04 DIAGNOSIS — Z1211 Encounter for screening for malignant neoplasm of colon: Secondary | ICD-10-CM | POA: Diagnosis not present

## 2021-08-04 MED ORDER — SODIUM CHLORIDE 0.9 % IV SOLN: 500.0000 mL | INTRAVENOUS | Status: DC

## 2021-08-04 NOTE — Op Note (Signed)
[facility] Patient Name: Sacred Roa Procedure Date: 08/04/2021 11:00 AM MRN: [id number] Endoscopist: Sonny Masters "Donna Hines ,  Age: 71 Referring MD:  Date of Birth: [birth year] Gender: Female Account #: [id number] Procedure:                Colonoscopy Indications:              Screening for colorectal malignant neoplasm Medicines:                Monitored Anesthesia Care Procedure:                Pre-Anesthesia Assessment:                           - Prior to the procedure, a History and Physical                            was performed, and patient medications and                            allergies were reviewed. The patient's tolerance of                            previous anesthesia was also reviewed. The risks                            and benefits of the procedure and the sedation                            options and risks were discussed with the patient.                            All questions were answered, and informed consent                            was obtained. Prior Anticoagulants: The patient has                            taken no previous anticoagulant or antiplatelet                            agents. ASA Grade Assessment: II - A patient with                            mild systemic disease. After reviewing the risks                            and benefits, the patient was deemed in                            satisfactory condition to undergo the procedure.                           After obtaining informed consent, the colonoscope  was passed under direct vision. Throughout the                            procedure, the patient's blood pressure, pulse, and                            oxygen saturations were monitored continuously. The                            Olympus CF-HQ190L [id number]) Colonoscope was                            introduced through the anus and advanced to the the                            terminal  ileum. The colonoscopy was performed                            without difficulty. The patient tolerated the                            procedure well. The quality of the bowel                            preparation was good. The terminal ileum, ileocecal                            valve, appendiceal orifice, and rectum were                            photographed. Scope In: 11:04:41 AM Scope Out: 11:53:08 AM Scope Withdrawal Time: 0 hours 25 minutes 42 seconds  Total Procedure Duration: 0 hours 48 minutes 27 seconds  Findings:                 The terminal ileum appeared normal.                           Six sessile polyps were found in the transverse                            colon, ascending colon and cecum. The polyps were 3                            to 12 mm in size. These polyps were removed with a                            cold snare. Resection and retrieval were complete.                           Non-bleeding internal hemorrhoids were found during                            retroflexion. Complications:  No immediate complications. Estimated Blood Loss:     Estimated blood loss was minimal. Impression:               - The examined portion of the ileum was normal.                           - Six 3 to 12 mm polyps in the transverse colon, in                            the ascending colon and in the cecum, removed with                            a cold snare. Resected and retrieved.                           - Non-bleeding internal hemorrhoids. Recommendation:           - Discharge patient to home (with escort).                           - Await pathology results.                           - The findings and recommendations were discussed                            with the patient. Sonny Masters "Donna Hines,  08/04/2021 11:59:59 AM

## 2021-08-04 NOTE — Progress Notes (Signed)
Called to room to assist during endoscopic procedure.  Patient ID and intended procedure confirmed with present staff. Received instructions for my participation in the procedure from the performing physician.  

## 2021-08-04 NOTE — Progress Notes (Signed)
GASTROENTEROLOGY PROCEDURE H&P NOTE   Primary Care Physician: Prince Solian, MD    Reason for Procedure:   Colon cancer screening  Plan:    Colonoscopy  Patient is appropriate for endoscopic procedure(s) in the ambulatory ([place]) setting.  The nature of the procedure, as well as the risks, benefits, and alternatives were carefully and thoroughly reviewed with the patient. Ample time for discussion and questions allowed. The patient understood, was satisfied, and agreed to proceed.     HPI: Donna Hines is a 71 y.o. female who presents for colonoscopy for colon cancer screening. She was recently seen in GI clinic on 07/27/21  Past Medical History:  Diagnosis Date   Cancer ([place])    skin, basal cell (forehead)   Fibroid    small   HSV-1 infection    Hyperlipidemia    Migraines    with aura   Thyroid disease    hypothyroid    Past Surgical History:  Procedure Laterality Date   CATARACT EXTRACTION     COLONOSCOPY     LIPOMA EXCISION  03/2020   thyroid  nodule aspirated  09/2010   cyst benign   TUBAL LIGATION     UMBILICAL HERNIA REPAIR     with BTL    Prior to Admission medications   Medication Sig Start Date End Date Taking? Authorizing Provider  atorvastatin (LIPITOR) 10 MG tablet Take 10 mg by mouth daily.    [provider]  BIOTIN PO Take 1 capsule by mouth daily.    [provider]  calcium carbonate (OS-CAL - DOSED IN MG OF ELEMENTAL CALCIUM) 1250 (500 Ca) MG tablet Take 1 tablet by mouth daily.    [provider]  Ginger, Zingiber officinalis, (GINGER ROOT PO) Take 1 capsule by mouth daily.    [provider]  levothyroxine (SYNTHROID, LEVOTHROID) 88 MCG tablet Take 88 mcg by mouth daily before breakfast.    [provider]  meloxicam (MOBIC) 7.5 MG tablet Take 7.5 mg by mouth daily.    [provider]  ondansetron (ZOFRAN-ODT) 4 MG disintegrating tablet Take 1 tablet (4 mg total) by mouth  every 8 (eight) hours as needed for nausea or vomiting. 07/27/21   Sharyn Creamer, MD  OVER THE COUNTER MEDICATION Nutrafol daily    [provider]  Semaglutide,0.25 or 0.5MG /DOS, (OZEMPIC, 0.25 OR 0.5 MG/DOSE,) 2 MG/1.5ML SOPN inject 0.5mg  SUBCUTANEOUSLY ONCE WEEKLY. 03/05/20   [provider]  valACYclovir (VALTREX) 500 MG tablet as needed. 07/21/18   [provider]    Current Outpatient Medications  Medication Sig Dispense Refill   atorvastatin (LIPITOR) 10 MG tablet Take 10 mg by mouth daily.     BIOTIN PO Take 1 capsule by mouth daily.     calcium carbonate (OS-CAL - DOSED IN MG OF ELEMENTAL CALCIUM) 1250 (500 Ca) MG tablet Take 1 tablet by mouth daily.     Ginger, Zingiber officinalis, (GINGER ROOT PO) Take 1 capsule by mouth daily.     levothyroxine (SYNTHROID, LEVOTHROID) 88 MCG tablet Take 88 mcg by mouth daily before breakfast.     meloxicam (MOBIC) 7.5 MG tablet Take 7.5 mg by mouth daily.     ondansetron (ZOFRAN-ODT) 4 MG disintegrating tablet Take 1 tablet (4 mg total) by mouth every 8 (eight) hours as needed for nausea or vomiting. 20 tablet 0   OVER THE COUNTER MEDICATION Nutrafol daily     Semaglutide,0.25 or 0.5MG /DOS, (OZEMPIC, 0.25 OR 0.5 MG/DOSE,) 2 MG/1.5ML SOPN  inject 0.5mg  SUBCUTANEOUSLY ONCE WEEKLY.     valACYclovir (VALTREX) 500 MG tablet as needed.     Current Facility-Administered Medications  Medication Dose Route Frequency Provider Last Rate Last Admin   0.9 %  sodium chloride infusion  500 mL Intravenous Continuous Sharyn Creamer, MD        Allergies as of 08/04/2021 - Review Complete 07/27/2021  Allergen Reaction Noted   Clindamycin/lincomycin Rash 08/05/2013    Family History  Problem Relation Age of Onset   Other Mother        brain tumor   Colon cancer Neg Hx    Colon polyps Neg Hx    Esophageal cancer Neg Hx    Rectal cancer Neg Hx    Stomach cancer Neg Hx     Social History   Socioeconomic History   Marital  status: Divorced    Spouse name: Not on file   Number of children: Not on file   Years of education: Not on file   Highest education level: Not on file  Occupational History   Not on file  Tobacco Use   Smoking status: Never   Smokeless tobacco: Never  Vaping Use   Vaping Use: Never used  Substance and Sexual Activity   Alcohol use: Yes    Alcohol/week: 1.0 standard drink    Types: 1 Standard drinks or equivalent per week    Comment: 1 month   Drug use: Never   Sexual activity: Not Currently    Partners: Male    Birth control/protection: Surgical, Post-menopausal    Comment: btl  Other Topics Concern   Not on file  Social History Narrative   Not on file   Social Determinants of Health   Financial Resource Strain: Not on file  Food Insecurity: Not on file  Transportation Needs: Not on file  Physical Activity: Not on file  Stress: Not on file  Social Connections: Not on file  Intimate Partner Violence: Not on file    Physical Exam: Vital signs in last 24 hours: BP 115/69    Pulse 83    Temp (!) 96.8 F (36 C)    Ht 5\' 5"  (1.651 m)    Wt 152 lb (68.9 kg)    LMP 06/12/1999    SpO2 96%    BMI 25.29 kg/m  GEN: NAD EYE: Sclerae anicteric ENT: MMM CV: Non-tachycardic Pulm: No increased work of breathing GI: Soft, NT/ND NEURO:  Alert & Oriented   Donna Reading, MD [facility]  08/04/2021 10:25 AM

## 2021-08-04 NOTE — Patient Instructions (Signed)
Resume previous diet and medications. °Awaiting pathology results. Repeat Colonoscopy date to be determined based on pathology results. ° °YOU HAD AN ENDOSCOPIC PROCEDURE TODAY AT THE [facility]:   Refer to the procedure report that was given to you for any specific questions about what was found during the examination.  If the procedure report does not answer your questions, please call your gastroenterologist to clarify.  If you requested that your care partner not be given the details of your procedure findings, then the procedure report has been included in a sealed envelope for you to review at your convenience later. ° °YOU SHOULD EXPECT: Some feelings of bloating in the abdomen. Passage of more gas than usual.  Walking can help get rid of the air that was put into your GI tract during the procedure and reduce the bloating. If you had a lower endoscopy (such as a colonoscopy or flexible sigmoidoscopy) you may notice spotting of blood in your stool or on the toilet paper. If you underwent a bowel prep for your procedure, you may not have a normal bowel movement for a few days. ° °Please Note:  You might notice some irritation and congestion in your nose or some drainage.  This is from the oxygen used during your procedure.  There is no need for concern and it should clear up in a day or so. ° °SYMPTOMS TO REPORT IMMEDIATELY: ° °Following lower endoscopy (colonoscopy or flexible sigmoidoscopy): ° Excessive amounts of blood in the stool ° Significant tenderness or worsening of abdominal pains ° Swelling of the abdomen that is new, acute ° Fever of 100°F or higher ° °For urgent or emergent issues, a gastroenterologist can be reached at any hour by calling [phone]. °Do not use MyChart messaging for urgent concerns.  ° ° °DIET:  We do recommend a small meal at first, but then you may proceed to your regular diet.  Drink plenty of fluids but you should avoid alcoholic beverages for 24  hours. ° °ACTIVITY:  You should plan to take it easy for the rest of today and you should NOT DRIVE or use heavy machinery until tomorrow (because of the sedation medicines used during the test).   ° °FOLLOW UP: °Our staff will call the number listed on your records 48-72 hours following your procedure to check on you and address any questions or concerns that you may have regarding the information given to you following your procedure. If we do not reach you, we will leave a message.  We will attempt to reach you two times.  During this call, we will ask if you have developed any symptoms of COVID 19. If you develop any symptoms (ie: fever, flu-like symptoms, shortness of breath, cough etc.) before then, please call [phone].  If you test positive for Covid 19 in the 2 weeks post procedure, please call and report this information to us.   ° °If any biopsies were taken you will be contacted by phone or by letter within the next 1-3 weeks.  Please call us at [phone] if you have not heard about the biopsies in 3 weeks.  ° ° °SIGNATURES/CONFIDENTIALITY: °You and/or your care partner have signed paperwork which will be entered into your electronic medical record.  These signatures attest to the fact that that the information above on your After Visit Summary has been reviewed and is understood.  Full responsibility of the confidentiality of this discharge information lies with you and/or your care-partner.  °

## 2021-08-04 NOTE — Progress Notes (Signed)
To Pacu, VSS. Report to [email or web address] 

## 2021-08-08 ENCOUNTER — Telehealth: Payer: Self-pay

## 2021-08-08 NOTE — Telephone Encounter (Signed)
°  Follow up Call-  Call back number 08/04/2021 07/07/2021  Post procedure Call Back phone  # [phone]  Permission to leave phone message Yes Yes  Some recent data might be hidden     Patient questions:  Do you have a fever, pain , or abdominal swelling? No. Pain Score  0 *  Have you tolerated food without any problems? Yes.    Have you been able to return to your normal activities? Yes.    Do you have any questions about your discharge instructions: Diet   No. Medications  No. Follow up visit  No.  Do you have questions or concerns about your Care? No.  Actions: * If pain score is 4 or above: No action needed, pain <4.

## 2021-08-09 ENCOUNTER — Encounter: Payer: Self-pay | Admitting: Internal Medicine

## 2021-08-09 DIAGNOSIS — D225 Melanocytic nevi of trunk: Secondary | ICD-10-CM | POA: Diagnosis not present

## 2021-08-09 DIAGNOSIS — D485 Neoplasm of uncertain behavior of skin: Secondary | ICD-10-CM | POA: Diagnosis not present

## 2021-08-09 DIAGNOSIS — L821 Other seborrheic keratosis: Secondary | ICD-10-CM | POA: Diagnosis not present

## 2021-08-09 DIAGNOSIS — L570 Actinic keratosis: Secondary | ICD-10-CM | POA: Diagnosis not present

## 2021-08-09 DIAGNOSIS — C44612 Basal cell carcinoma of skin of right upper limb, including shoulder: Secondary | ICD-10-CM | POA: Diagnosis not present

## 2021-08-09 DIAGNOSIS — L239 Allergic contact dermatitis, unspecified cause: Secondary | ICD-10-CM | POA: Diagnosis not present

## 2021-08-09 DIAGNOSIS — L308 Other specified dermatitis: Secondary | ICD-10-CM | POA: Diagnosis not present

## 2021-08-09 DIAGNOSIS — C44519 Basal cell carcinoma of skin of other part of trunk: Secondary | ICD-10-CM | POA: Diagnosis not present

## 2021-08-09 DIAGNOSIS — L814 Other melanin hyperpigmentation: Secondary | ICD-10-CM | POA: Diagnosis not present

## 2021-08-09 DIAGNOSIS — Z85828 Personal history of other malignant neoplasm of skin: Secondary | ICD-10-CM | POA: Diagnosis not present

## 2021-09-27 DIAGNOSIS — L910 Hypertrophic scar: Secondary | ICD-10-CM | POA: Diagnosis not present

## 2021-12-07 DIAGNOSIS — E039 Hypothyroidism, unspecified: Secondary | ICD-10-CM | POA: Diagnosis not present

## 2021-12-07 DIAGNOSIS — M1711 Unilateral primary osteoarthritis, right knee: Secondary | ICD-10-CM | POA: Diagnosis not present

## 2021-12-07 DIAGNOSIS — E042 Nontoxic multinodular goiter: Secondary | ICD-10-CM | POA: Diagnosis not present

## 2021-12-07 DIAGNOSIS — Z01818 Encounter for other preprocedural examination: Secondary | ICD-10-CM | POA: Diagnosis not present

## 2021-12-07 DIAGNOSIS — E669 Obesity, unspecified: Secondary | ICD-10-CM | POA: Diagnosis not present

## 2021-12-07 DIAGNOSIS — Z1231 Encounter for screening mammogram for malignant neoplasm of breast: Secondary | ICD-10-CM | POA: Diagnosis not present

## 2021-12-07 DIAGNOSIS — R7301 Impaired fasting glucose: Secondary | ICD-10-CM | POA: Diagnosis not present

## 2021-12-07 DIAGNOSIS — E785 Hyperlipidemia, unspecified: Secondary | ICD-10-CM | POA: Diagnosis not present

## 2021-12-11 DIAGNOSIS — M1711 Unilateral primary osteoarthritis, right knee: Secondary | ICD-10-CM | POA: Diagnosis not present

## 2021-12-13 DIAGNOSIS — Z01812 Encounter for preprocedural laboratory examination: Secondary | ICD-10-CM | POA: Diagnosis not present

## 2021-12-13 DIAGNOSIS — M25561 Pain in right knee: Secondary | ICD-10-CM | POA: Diagnosis not present

## 2021-12-13 DIAGNOSIS — M1711 Unilateral primary osteoarthritis, right knee: Secondary | ICD-10-CM | POA: Diagnosis not present

## 2021-12-19 DIAGNOSIS — M1711 Unilateral primary osteoarthritis, right knee: Secondary | ICD-10-CM | POA: Diagnosis not present

## 2021-12-21 DIAGNOSIS — Z96651 Presence of right artificial knee joint: Secondary | ICD-10-CM | POA: Diagnosis not present

## 2021-12-21 DIAGNOSIS — G8918 Other acute postprocedural pain: Secondary | ICD-10-CM | POA: Diagnosis not present

## 2021-12-21 DIAGNOSIS — M1711 Unilateral primary osteoarthritis, right knee: Secondary | ICD-10-CM | POA: Diagnosis not present

## 2021-12-23 DIAGNOSIS — M1711 Unilateral primary osteoarthritis, right knee: Secondary | ICD-10-CM | POA: Diagnosis not present

## 2021-12-23 DIAGNOSIS — Z96651 Presence of right artificial knee joint: Secondary | ICD-10-CM | POA: Diagnosis not present

## 2021-12-25 DIAGNOSIS — Z96651 Presence of right artificial knee joint: Secondary | ICD-10-CM | POA: Diagnosis not present

## 2021-12-25 DIAGNOSIS — M6281 Muscle weakness (generalized): Secondary | ICD-10-CM | POA: Diagnosis not present

## 2021-12-25 DIAGNOSIS — M1711 Unilateral primary osteoarthritis, right knee: Secondary | ICD-10-CM | POA: Diagnosis not present

## 2021-12-25 DIAGNOSIS — M25661 Stiffness of right knee, not elsewhere classified: Secondary | ICD-10-CM | POA: Diagnosis not present

## 2021-12-25 DIAGNOSIS — R262 Difficulty in walking, not elsewhere classified: Secondary | ICD-10-CM | POA: Diagnosis not present

## 2021-12-28 DIAGNOSIS — M1711 Unilateral primary osteoarthritis, right knee: Secondary | ICD-10-CM | POA: Diagnosis not present

## 2021-12-28 DIAGNOSIS — M6281 Muscle weakness (generalized): Secondary | ICD-10-CM | POA: Diagnosis not present

## 2021-12-28 DIAGNOSIS — M25661 Stiffness of right knee, not elsewhere classified: Secondary | ICD-10-CM | POA: Diagnosis not present

## 2021-12-28 DIAGNOSIS — R262 Difficulty in walking, not elsewhere classified: Secondary | ICD-10-CM | POA: Diagnosis not present

## 2021-12-28 DIAGNOSIS — Z96651 Presence of right artificial knee joint: Secondary | ICD-10-CM | POA: Diagnosis not present

## 2022-01-01 DIAGNOSIS — Z96651 Presence of right artificial knee joint: Secondary | ICD-10-CM | POA: Diagnosis not present

## 2022-01-01 DIAGNOSIS — R262 Difficulty in walking, not elsewhere classified: Secondary | ICD-10-CM | POA: Diagnosis not present

## 2022-01-01 DIAGNOSIS — M6281 Muscle weakness (generalized): Secondary | ICD-10-CM | POA: Diagnosis not present

## 2022-01-01 DIAGNOSIS — M1711 Unilateral primary osteoarthritis, right knee: Secondary | ICD-10-CM | POA: Diagnosis not present

## 2022-01-01 DIAGNOSIS — M25661 Stiffness of right knee, not elsewhere classified: Secondary | ICD-10-CM | POA: Diagnosis not present

## 2022-01-03 DIAGNOSIS — M1711 Unilateral primary osteoarthritis, right knee: Secondary | ICD-10-CM | POA: Diagnosis not present

## 2022-01-10 DIAGNOSIS — M25661 Stiffness of right knee, not elsewhere classified: Secondary | ICD-10-CM | POA: Diagnosis not present

## 2022-01-10 DIAGNOSIS — Z96651 Presence of right artificial knee joint: Secondary | ICD-10-CM | POA: Diagnosis not present

## 2022-01-10 DIAGNOSIS — M6281 Muscle weakness (generalized): Secondary | ICD-10-CM | POA: Diagnosis not present

## 2022-01-10 DIAGNOSIS — R262 Difficulty in walking, not elsewhere classified: Secondary | ICD-10-CM | POA: Diagnosis not present

## 2022-01-10 DIAGNOSIS — M1711 Unilateral primary osteoarthritis, right knee: Secondary | ICD-10-CM | POA: Diagnosis not present

## 2022-01-12 DIAGNOSIS — M6281 Muscle weakness (generalized): Secondary | ICD-10-CM | POA: Diagnosis not present

## 2022-01-12 DIAGNOSIS — R262 Difficulty in walking, not elsewhere classified: Secondary | ICD-10-CM | POA: Diagnosis not present

## 2022-01-12 DIAGNOSIS — Z96651 Presence of right artificial knee joint: Secondary | ICD-10-CM | POA: Diagnosis not present

## 2022-01-12 DIAGNOSIS — M25661 Stiffness of right knee, not elsewhere classified: Secondary | ICD-10-CM | POA: Diagnosis not present

## 2022-01-12 DIAGNOSIS — M1711 Unilateral primary osteoarthritis, right knee: Secondary | ICD-10-CM | POA: Diagnosis not present

## 2022-01-15 DIAGNOSIS — M25661 Stiffness of right knee, not elsewhere classified: Secondary | ICD-10-CM | POA: Diagnosis not present

## 2022-01-15 DIAGNOSIS — Z96651 Presence of right artificial knee joint: Secondary | ICD-10-CM | POA: Diagnosis not present

## 2022-01-15 DIAGNOSIS — R262 Difficulty in walking, not elsewhere classified: Secondary | ICD-10-CM | POA: Diagnosis not present

## 2022-01-15 DIAGNOSIS — M1711 Unilateral primary osteoarthritis, right knee: Secondary | ICD-10-CM | POA: Diagnosis not present

## 2022-01-15 DIAGNOSIS — M6281 Muscle weakness (generalized): Secondary | ICD-10-CM | POA: Diagnosis not present

## 2022-01-18 DIAGNOSIS — R262 Difficulty in walking, not elsewhere classified: Secondary | ICD-10-CM | POA: Diagnosis not present

## 2022-01-18 DIAGNOSIS — M1711 Unilateral primary osteoarthritis, right knee: Secondary | ICD-10-CM | POA: Diagnosis not present

## 2022-01-18 DIAGNOSIS — Z96651 Presence of right artificial knee joint: Secondary | ICD-10-CM | POA: Diagnosis not present

## 2022-01-18 DIAGNOSIS — M25661 Stiffness of right knee, not elsewhere classified: Secondary | ICD-10-CM | POA: Diagnosis not present

## 2022-01-18 DIAGNOSIS — M6281 Muscle weakness (generalized): Secondary | ICD-10-CM | POA: Diagnosis not present

## 2022-01-20 DIAGNOSIS — E785 Hyperlipidemia, unspecified: Secondary | ICD-10-CM | POA: Diagnosis not present

## 2022-01-20 DIAGNOSIS — Z7985 Long-term (current) use of injectable non-insulin antidiabetic drugs: Secondary | ICD-10-CM | POA: Diagnosis not present

## 2022-01-20 DIAGNOSIS — M1990 Unspecified osteoarthritis, unspecified site: Secondary | ICD-10-CM | POA: Diagnosis not present

## 2022-01-20 DIAGNOSIS — R030 Elevated blood-pressure reading, without diagnosis of hypertension: Secondary | ICD-10-CM | POA: Diagnosis not present

## 2022-01-20 DIAGNOSIS — E039 Hypothyroidism, unspecified: Secondary | ICD-10-CM | POA: Diagnosis not present

## 2022-01-20 DIAGNOSIS — B001 Herpesviral vesicular dermatitis: Secondary | ICD-10-CM | POA: Diagnosis not present

## 2022-01-20 DIAGNOSIS — Z809 Family history of malignant neoplasm, unspecified: Secondary | ICD-10-CM | POA: Diagnosis not present

## 2022-01-20 DIAGNOSIS — Z85828 Personal history of other malignant neoplasm of skin: Secondary | ICD-10-CM | POA: Diagnosis not present

## 2022-01-20 DIAGNOSIS — Z008 Encounter for other general examination: Secondary | ICD-10-CM | POA: Diagnosis not present

## 2022-01-22 DIAGNOSIS — M25661 Stiffness of right knee, not elsewhere classified: Secondary | ICD-10-CM | POA: Diagnosis not present

## 2022-01-22 DIAGNOSIS — M6281 Muscle weakness (generalized): Secondary | ICD-10-CM | POA: Diagnosis not present

## 2022-01-22 DIAGNOSIS — R262 Difficulty in walking, not elsewhere classified: Secondary | ICD-10-CM | POA: Diagnosis not present

## 2022-01-22 DIAGNOSIS — M1711 Unilateral primary osteoarthritis, right knee: Secondary | ICD-10-CM | POA: Diagnosis not present

## 2022-01-22 DIAGNOSIS — Z96651 Presence of right artificial knee joint: Secondary | ICD-10-CM | POA: Diagnosis not present

## 2022-01-31 DIAGNOSIS — M1711 Unilateral primary osteoarthritis, right knee: Secondary | ICD-10-CM | POA: Diagnosis not present

## 2022-02-14 DIAGNOSIS — S0501XA Injury of conjunctiva and corneal abrasion without foreign body, right eye, initial encounter: Secondary | ICD-10-CM | POA: Diagnosis not present

## 2022-03-01 DIAGNOSIS — Z23 Encounter for immunization: Secondary | ICD-10-CM | POA: Diagnosis not present

## 2022-03-13 DIAGNOSIS — E039 Hypothyroidism, unspecified: Secondary | ICD-10-CM | POA: Diagnosis not present

## 2022-03-13 DIAGNOSIS — R7989 Other specified abnormal findings of blood chemistry: Secondary | ICD-10-CM | POA: Diagnosis not present

## 2022-03-13 DIAGNOSIS — R7301 Impaired fasting glucose: Secondary | ICD-10-CM | POA: Diagnosis not present

## 2022-03-13 DIAGNOSIS — E785 Hyperlipidemia, unspecified: Secondary | ICD-10-CM | POA: Diagnosis not present

## 2022-03-20 DIAGNOSIS — E669 Obesity, unspecified: Secondary | ICD-10-CM | POA: Diagnosis not present

## 2022-03-20 DIAGNOSIS — Z0000 Encounter for general adult medical examination without abnormal findings: Secondary | ICD-10-CM | POA: Diagnosis not present

## 2022-03-20 DIAGNOSIS — R7301 Impaired fasting glucose: Secondary | ICD-10-CM | POA: Diagnosis not present

## 2022-03-20 DIAGNOSIS — Z23 Encounter for immunization: Secondary | ICD-10-CM | POA: Diagnosis not present

## 2022-03-20 DIAGNOSIS — M1711 Unilateral primary osteoarthritis, right knee: Secondary | ICD-10-CM | POA: Diagnosis not present

## 2022-03-20 DIAGNOSIS — D1779 Benign lipomatous neoplasm of other sites: Secondary | ICD-10-CM | POA: Diagnosis not present

## 2022-03-20 DIAGNOSIS — D126 Benign neoplasm of colon, unspecified: Secondary | ICD-10-CM | POA: Diagnosis not present

## 2022-03-20 DIAGNOSIS — R82998 Other abnormal findings in urine: Secondary | ICD-10-CM | POA: Diagnosis not present

## 2022-03-20 DIAGNOSIS — E785 Hyperlipidemia, unspecified: Secondary | ICD-10-CM | POA: Diagnosis not present

## 2022-03-20 DIAGNOSIS — E042 Nontoxic multinodular goiter: Secondary | ICD-10-CM | POA: Diagnosis not present

## 2022-03-20 DIAGNOSIS — E039 Hypothyroidism, unspecified: Secondary | ICD-10-CM | POA: Diagnosis not present

## 2022-03-30 DIAGNOSIS — Z961 Presence of intraocular lens: Secondary | ICD-10-CM | POA: Diagnosis not present

## 2022-03-30 DIAGNOSIS — H04123 Dry eye syndrome of bilateral lacrimal glands: Secondary | ICD-10-CM | POA: Diagnosis not present

## 2022-03-30 DIAGNOSIS — H26493 Other secondary cataract, bilateral: Secondary | ICD-10-CM | POA: Diagnosis not present

## 2022-03-30 DIAGNOSIS — H26492 Other secondary cataract, left eye: Secondary | ICD-10-CM | POA: Diagnosis not present

## 2022-05-09 DIAGNOSIS — L03011 Cellulitis of right finger: Secondary | ICD-10-CM | POA: Diagnosis not present

## 2022-05-09 DIAGNOSIS — B351 Tinea unguium: Secondary | ICD-10-CM | POA: Diagnosis not present

## 2022-05-09 DIAGNOSIS — E039 Hypothyroidism, unspecified: Secondary | ICD-10-CM | POA: Diagnosis not present

## 2022-05-09 DIAGNOSIS — E785 Hyperlipidemia, unspecified: Secondary | ICD-10-CM | POA: Diagnosis not present

## 2022-05-09 DIAGNOSIS — Z85828 Personal history of other malignant neoplasm of skin: Secondary | ICD-10-CM | POA: Diagnosis not present

## 2022-07-05 DIAGNOSIS — R7989 Other specified abnormal findings of blood chemistry: Secondary | ICD-10-CM | POA: Diagnosis not present

## 2022-07-05 DIAGNOSIS — E785 Hyperlipidemia, unspecified: Secondary | ICD-10-CM | POA: Diagnosis not present

## 2022-07-05 DIAGNOSIS — E039 Hypothyroidism, unspecified: Secondary | ICD-10-CM | POA: Diagnosis not present

## 2022-08-09 DIAGNOSIS — L812 Freckles: Secondary | ICD-10-CM | POA: Diagnosis not present

## 2022-08-09 DIAGNOSIS — C44622 Squamous cell carcinoma of skin of right upper limb, including shoulder: Secondary | ICD-10-CM | POA: Diagnosis not present

## 2022-08-09 DIAGNOSIS — D1801 Hemangioma of skin and subcutaneous tissue: Secondary | ICD-10-CM | POA: Diagnosis not present

## 2022-08-09 DIAGNOSIS — L821 Other seborrheic keratosis: Secondary | ICD-10-CM | POA: Diagnosis not present

## 2022-08-09 DIAGNOSIS — D485 Neoplasm of uncertain behavior of skin: Secondary | ICD-10-CM | POA: Diagnosis not present

## 2022-08-09 DIAGNOSIS — B351 Tinea unguium: Secondary | ICD-10-CM | POA: Diagnosis not present

## 2022-08-09 DIAGNOSIS — L570 Actinic keratosis: Secondary | ICD-10-CM | POA: Diagnosis not present

## 2022-08-09 DIAGNOSIS — Z85828 Personal history of other malignant neoplasm of skin: Secondary | ICD-10-CM | POA: Diagnosis not present

## 2022-08-09 DIAGNOSIS — C44519 Basal cell carcinoma of skin of other part of trunk: Secondary | ICD-10-CM | POA: Diagnosis not present

## 2022-08-09 DIAGNOSIS — D225 Melanocytic nevi of trunk: Secondary | ICD-10-CM | POA: Diagnosis not present

## 2022-09-04 DIAGNOSIS — E039 Hypothyroidism, unspecified: Secondary | ICD-10-CM | POA: Diagnosis not present

## 2022-09-04 DIAGNOSIS — R7301 Impaired fasting glucose: Secondary | ICD-10-CM | POA: Diagnosis not present

## 2022-09-04 DIAGNOSIS — E042 Nontoxic multinodular goiter: Secondary | ICD-10-CM | POA: Diagnosis not present

## 2022-09-04 DIAGNOSIS — E669 Obesity, unspecified: Secondary | ICD-10-CM | POA: Diagnosis not present

## 2022-10-22 DIAGNOSIS — H00016 Hordeolum externum left eye, unspecified eyelid: Secondary | ICD-10-CM | POA: Diagnosis not present

## 2022-10-22 DIAGNOSIS — H119 Unspecified disorder of conjunctiva: Secondary | ICD-10-CM | POA: Diagnosis not present

## 2022-11-28 DIAGNOSIS — Z0000 Encounter for general adult medical examination without abnormal findings: Secondary | ICD-10-CM | POA: Diagnosis not present

## 2022-11-28 DIAGNOSIS — E039 Hypothyroidism, unspecified: Secondary | ICD-10-CM | POA: Diagnosis not present

## 2022-11-28 DIAGNOSIS — E041 Nontoxic single thyroid nodule: Secondary | ICD-10-CM | POA: Diagnosis not present

## 2022-11-28 DIAGNOSIS — E042 Nontoxic multinodular goiter: Secondary | ICD-10-CM | POA: Diagnosis not present

## 2022-12-04 DIAGNOSIS — R7301 Impaired fasting glucose: Secondary | ICD-10-CM | POA: Diagnosis not present

## 2022-12-04 DIAGNOSIS — E042 Nontoxic multinodular goiter: Secondary | ICD-10-CM | POA: Diagnosis not present

## 2022-12-04 DIAGNOSIS — E039 Hypothyroidism, unspecified: Secondary | ICD-10-CM | POA: Diagnosis not present

## 2022-12-04 DIAGNOSIS — E669 Obesity, unspecified: Secondary | ICD-10-CM | POA: Diagnosis not present

## 2022-12-10 DIAGNOSIS — Z1231 Encounter for screening mammogram for malignant neoplasm of breast: Secondary | ICD-10-CM | POA: Diagnosis not present

## 2023-03-07 DIAGNOSIS — N390 Urinary tract infection, site not specified: Secondary | ICD-10-CM | POA: Diagnosis not present

## 2023-03-07 DIAGNOSIS — R350 Frequency of micturition: Secondary | ICD-10-CM | POA: Diagnosis not present

## 2023-03-28 DIAGNOSIS — Z7985 Long-term (current) use of injectable non-insulin antidiabetic drugs: Secondary | ICD-10-CM | POA: Diagnosis not present

## 2023-03-28 DIAGNOSIS — E785 Hyperlipidemia, unspecified: Secondary | ICD-10-CM | POA: Diagnosis not present

## 2023-03-28 DIAGNOSIS — R030 Elevated blood-pressure reading, without diagnosis of hypertension: Secondary | ICD-10-CM | POA: Diagnosis not present

## 2023-03-28 DIAGNOSIS — R011 Cardiac murmur, unspecified: Secondary | ICD-10-CM | POA: Diagnosis not present

## 2023-03-28 DIAGNOSIS — Z809 Family history of malignant neoplasm, unspecified: Secondary | ICD-10-CM | POA: Diagnosis not present

## 2023-03-28 DIAGNOSIS — Z85828 Personal history of other malignant neoplasm of skin: Secondary | ICD-10-CM | POA: Diagnosis not present

## 2023-03-28 DIAGNOSIS — E039 Hypothyroidism, unspecified: Secondary | ICD-10-CM | POA: Diagnosis not present

## 2023-03-28 DIAGNOSIS — M1990 Unspecified osteoarthritis, unspecified site: Secondary | ICD-10-CM | POA: Diagnosis not present

## 2023-04-11 DIAGNOSIS — Z85828 Personal history of other malignant neoplasm of skin: Secondary | ICD-10-CM | POA: Diagnosis not present

## 2023-04-11 DIAGNOSIS — B351 Tinea unguium: Secondary | ICD-10-CM | POA: Diagnosis not present

## 2023-04-12 DIAGNOSIS — E785 Hyperlipidemia, unspecified: Secondary | ICD-10-CM | POA: Diagnosis not present

## 2023-04-12 DIAGNOSIS — E041 Nontoxic single thyroid nodule: Secondary | ICD-10-CM | POA: Diagnosis not present

## 2023-04-12 DIAGNOSIS — Z1212 Encounter for screening for malignant neoplasm of rectum: Secondary | ICD-10-CM | POA: Diagnosis not present

## 2023-04-12 DIAGNOSIS — R7301 Impaired fasting glucose: Secondary | ICD-10-CM | POA: Diagnosis not present

## 2023-04-12 DIAGNOSIS — E039 Hypothyroidism, unspecified: Secondary | ICD-10-CM | POA: Diagnosis not present

## 2023-04-23 DIAGNOSIS — D126 Benign neoplasm of colon, unspecified: Secondary | ICD-10-CM | POA: Diagnosis not present

## 2023-04-23 DIAGNOSIS — Z1331 Encounter for screening for depression: Secondary | ICD-10-CM | POA: Diagnosis not present

## 2023-04-23 DIAGNOSIS — R82998 Other abnormal findings in urine: Secondary | ICD-10-CM | POA: Diagnosis not present

## 2023-04-23 DIAGNOSIS — M1711 Unilateral primary osteoarthritis, right knee: Secondary | ICD-10-CM | POA: Diagnosis not present

## 2023-04-23 DIAGNOSIS — Z1339 Encounter for screening examination for other mental health and behavioral disorders: Secondary | ICD-10-CM | POA: Diagnosis not present

## 2023-04-23 DIAGNOSIS — Z0000 Encounter for general adult medical examination without abnormal findings: Secondary | ICD-10-CM | POA: Diagnosis not present

## 2023-04-23 DIAGNOSIS — R011 Cardiac murmur, unspecified: Secondary | ICD-10-CM | POA: Diagnosis not present

## 2023-04-23 DIAGNOSIS — E669 Obesity, unspecified: Secondary | ICD-10-CM | POA: Diagnosis not present

## 2023-04-23 DIAGNOSIS — D1779 Benign lipomatous neoplasm of other sites: Secondary | ICD-10-CM | POA: Diagnosis not present

## 2023-04-23 DIAGNOSIS — B351 Tinea unguium: Secondary | ICD-10-CM | POA: Diagnosis not present

## 2023-04-23 DIAGNOSIS — R7301 Impaired fasting glucose: Secondary | ICD-10-CM | POA: Diagnosis not present

## 2023-04-23 DIAGNOSIS — E785 Hyperlipidemia, unspecified: Secondary | ICD-10-CM | POA: Diagnosis not present

## 2023-04-23 DIAGNOSIS — E042 Nontoxic multinodular goiter: Secondary | ICD-10-CM | POA: Diagnosis not present

## 2023-04-30 ENCOUNTER — Other Ambulatory Visit (HOSPITAL_COMMUNITY): Payer: Self-pay | Admitting: Internal Medicine

## 2023-04-30 DIAGNOSIS — R011 Cardiac murmur, unspecified: Secondary | ICD-10-CM

## 2023-06-04 ENCOUNTER — Ambulatory Visit (HOSPITAL_COMMUNITY): Payer: Medicare HMO | Attending: Internal Medicine

## 2023-06-04 DIAGNOSIS — R011 Cardiac murmur, unspecified: Secondary | ICD-10-CM | POA: Diagnosis not present

## 2023-06-04 LAB — ECHOCARDIOGRAM COMPLETE
AR max vel: 2.03 cm2
AV Area VTI: 1.97 cm2
AV Area mean vel: 1.92 cm2
AV Mean grad: 7.5 mm[Hg]
AV Peak grad: 13.3 mm[Hg]
Ao pk vel: 1.83 m/s
Area-P 1/2: 3.12 cm2
S' Lateral: 2.6 cm

## 2023-08-15 DIAGNOSIS — Z85828 Personal history of other malignant neoplasm of skin: Secondary | ICD-10-CM | POA: Diagnosis not present

## 2023-08-15 DIAGNOSIS — L603 Nail dystrophy: Secondary | ICD-10-CM | POA: Diagnosis not present

## 2023-08-15 DIAGNOSIS — L821 Other seborrheic keratosis: Secondary | ICD-10-CM | POA: Diagnosis not present

## 2023-08-15 DIAGNOSIS — C44319 Basal cell carcinoma of skin of other parts of face: Secondary | ICD-10-CM | POA: Diagnosis not present

## 2023-08-15 DIAGNOSIS — L814 Other melanin hyperpigmentation: Secondary | ICD-10-CM | POA: Diagnosis not present

## 2023-08-15 DIAGNOSIS — D1801 Hemangioma of skin and subcutaneous tissue: Secondary | ICD-10-CM | POA: Diagnosis not present

## 2023-08-15 DIAGNOSIS — L570 Actinic keratosis: Secondary | ICD-10-CM | POA: Diagnosis not present

## 2023-08-15 DIAGNOSIS — C4441 Basal cell carcinoma of skin of scalp and neck: Secondary | ICD-10-CM | POA: Diagnosis not present

## 2023-10-07 DIAGNOSIS — C44319 Basal cell carcinoma of skin of other parts of face: Secondary | ICD-10-CM | POA: Diagnosis not present

## 2023-10-16 DIAGNOSIS — E042 Nontoxic multinodular goiter: Secondary | ICD-10-CM | POA: Diagnosis not present

## 2023-12-16 DIAGNOSIS — Z1231 Encounter for screening mammogram for malignant neoplasm of breast: Secondary | ICD-10-CM | POA: Diagnosis not present

## 2024-01-30 DIAGNOSIS — L720 Epidermal cyst: Secondary | ICD-10-CM | POA: Diagnosis not present

## 2024-01-30 DIAGNOSIS — L820 Inflamed seborrheic keratosis: Secondary | ICD-10-CM | POA: Diagnosis not present

## 2024-01-30 DIAGNOSIS — Z85828 Personal history of other malignant neoplasm of skin: Secondary | ICD-10-CM | POA: Diagnosis not present

## 2024-01-30 DIAGNOSIS — L570 Actinic keratosis: Secondary | ICD-10-CM | POA: Diagnosis not present

## 2024-01-30 DIAGNOSIS — L603 Nail dystrophy: Secondary | ICD-10-CM | POA: Diagnosis not present

## 2024-01-30 DIAGNOSIS — D2272 Melanocytic nevi of left lower limb, including hip: Secondary | ICD-10-CM | POA: Diagnosis not present

## 2024-01-30 DIAGNOSIS — L821 Other seborrheic keratosis: Secondary | ICD-10-CM | POA: Diagnosis not present

## 2024-01-30 DIAGNOSIS — D485 Neoplasm of uncertain behavior of skin: Secondary | ICD-10-CM | POA: Diagnosis not present

## 2024-04-01 DIAGNOSIS — L814 Other melanin hyperpigmentation: Secondary | ICD-10-CM | POA: Diagnosis not present

## 2024-04-01 DIAGNOSIS — D485 Neoplasm of uncertain behavior of skin: Secondary | ICD-10-CM | POA: Diagnosis not present

## 2024-04-22 DIAGNOSIS — Z1212 Encounter for screening for malignant neoplasm of rectum: Secondary | ICD-10-CM | POA: Diagnosis not present

## 2024-04-22 DIAGNOSIS — E785 Hyperlipidemia, unspecified: Secondary | ICD-10-CM | POA: Diagnosis not present

## 2024-04-27 DIAGNOSIS — R7301 Impaired fasting glucose: Secondary | ICD-10-CM | POA: Diagnosis not present

## 2024-04-27 DIAGNOSIS — B351 Tinea unguium: Secondary | ICD-10-CM | POA: Diagnosis not present

## 2024-04-27 DIAGNOSIS — E785 Hyperlipidemia, unspecified: Secondary | ICD-10-CM | POA: Diagnosis not present

## 2024-04-27 DIAGNOSIS — Z1339 Encounter for screening examination for other mental health and behavioral disorders: Secondary | ICD-10-CM | POA: Diagnosis not present

## 2024-04-27 DIAGNOSIS — Z0000 Encounter for general adult medical examination without abnormal findings: Secondary | ICD-10-CM | POA: Diagnosis not present

## 2024-04-27 DIAGNOSIS — E042 Nontoxic multinodular goiter: Secondary | ICD-10-CM | POA: Diagnosis not present

## 2024-04-27 DIAGNOSIS — M1711 Unilateral primary osteoarthritis, right knee: Secondary | ICD-10-CM | POA: Diagnosis not present

## 2024-04-27 DIAGNOSIS — Z780 Asymptomatic menopausal state: Secondary | ICD-10-CM | POA: Diagnosis not present

## 2024-04-27 DIAGNOSIS — R82998 Other abnormal findings in urine: Secondary | ICD-10-CM | POA: Diagnosis not present

## 2024-04-27 DIAGNOSIS — E039 Hypothyroidism, unspecified: Secondary | ICD-10-CM | POA: Diagnosis not present

## 2024-04-27 DIAGNOSIS — Z23 Encounter for immunization: Secondary | ICD-10-CM | POA: Diagnosis not present

## 2024-04-27 DIAGNOSIS — R011 Cardiac murmur, unspecified: Secondary | ICD-10-CM | POA: Diagnosis not present

## 2024-04-27 DIAGNOSIS — E669 Obesity, unspecified: Secondary | ICD-10-CM | POA: Diagnosis not present

## 2024-04-27 DIAGNOSIS — Z1331 Encounter for screening for depression: Secondary | ICD-10-CM | POA: Diagnosis not present

## 2024-06-01 DIAGNOSIS — R82998 Other abnormal findings in urine: Secondary | ICD-10-CM | POA: Diagnosis not present
